# Patient Record
Sex: Male | Born: 1940
Health system: Southern US, Community
[De-identification: ages and names within clinical notes are randomized; demographics above are authoritative.]

## PROBLEM LIST (undated history)

## (undated) DIAGNOSIS — E119 Type 2 diabetes mellitus without complications: Secondary | ICD-10-CM

## (undated) DIAGNOSIS — M109 Gout, unspecified: Secondary | ICD-10-CM

## (undated) DIAGNOSIS — I10 Essential (primary) hypertension: Secondary | ICD-10-CM

## (undated) DIAGNOSIS — K219 Gastro-esophageal reflux disease without esophagitis: Secondary | ICD-10-CM

## (undated) DIAGNOSIS — G2581 Restless legs syndrome: Secondary | ICD-10-CM

## (undated) HISTORY — PX: APPENDECTOMY: SHX54

## (undated) HISTORY — PX: CHOLECYSTECTOMY: SHX55

## (undated) HISTORY — PX: OTHER SURGICAL HISTORY: SHX169

---

## 2006-12-07 ENCOUNTER — Ambulatory Visit: Payer: Self-pay | Admitting: Orthopaedic Surgery

## 2006-12-14 ENCOUNTER — Other Ambulatory Visit: Payer: Self-pay

## 2006-12-14 ENCOUNTER — Ambulatory Visit: Payer: Self-pay | Admitting: Orthopaedic Surgery

## 2006-12-20 ENCOUNTER — Ambulatory Visit: Payer: Self-pay | Admitting: Orthopaedic Surgery

## 2009-08-22 ENCOUNTER — Ambulatory Visit: Payer: Self-pay | Admitting: Internal Medicine

## 2011-05-07 ENCOUNTER — Emergency Department: Payer: Self-pay | Admitting: Emergency Medicine

## 2011-06-06 ENCOUNTER — Ambulatory Visit: Payer: Self-pay | Admitting: Internal Medicine

## 2012-05-01 ENCOUNTER — Observation Stay: Payer: Self-pay | Admitting: Internal Medicine

## 2012-05-01 LAB — URINALYSIS, COMPLETE
Bilirubin,UR: NEGATIVE
Glucose,UR: 150 mg/dL (ref 0–75)
Hyaline Cast: 1
Nitrite: NEGATIVE
Ph: 5 (ref 4.5–8.0)
RBC,UR: 1 /HPF (ref 0–5)
Specific Gravity: 1.018 (ref 1.003–1.030)
Squamous Epithelial: <1

## 2012-05-01 LAB — COMPREHENSIVE METABOLIC PANEL
Anion Gap: 13 (ref 7–16)
Bilirubin,Total: 1.8 mg/dL — ABNORMAL HIGH (ref 0.2–1.0)
Chloride: 93 mmol/L — ABNORMAL LOW (ref 98–107)
Co2: 27 mmol/L (ref 21–32)
Creatinine: 1.24 mg/dL (ref 0.60–1.30)
EGFR (African American): >60
EGFR (Non-African Amer.): 58 — ABNORMAL LOW
Osmolality: 275 (ref 275–301)
Potassium: 3 mmol/L — ABNORMAL LOW (ref 3.5–5.1)
SGPT (ALT): 40 U/L (ref 12–78)
Sodium: 133 mmol/L — ABNORMAL LOW (ref 136–145)
Total Protein: 7.9 g/dL (ref 6.4–8.2)

## 2012-05-01 LAB — CBC
HCT: 41.6 % (ref 40.0–52.0)
MCH: 31.7 pg (ref 26.0–34.0)
MCHC: 34.8 g/dL (ref 32.0–36.0)
MCV: 91 fL (ref 80–100)
Platelet: 203 10*3/uL (ref 150–440)
RBC: 4.57 10*6/uL (ref 4.40–5.90)

## 2012-05-01 LAB — TROPONIN I: Troponin-I: <0.02 ng/mL

## 2012-05-02 LAB — CBC WITH DIFFERENTIAL/PLATELET
Basophil #: 0 10*3/uL (ref 0.0–0.1)
Basophil %: 0.2 %
Eosinophil #: 0.1 10*3/uL (ref 0.0–0.7)
HCT: 36.5 % — ABNORMAL LOW (ref 40.0–52.0)
HGB: 12.8 g/dL — ABNORMAL LOW (ref 13.0–18.0)
Lymphocyte #: 1.9 10*3/uL (ref 1.0–3.6)
MCH: 31.8 pg (ref 26.0–34.0)
MCHC: 34.9 g/dL (ref 32.0–36.0)
MCV: 91 fL (ref 80–100)
Monocyte #: 0.9 x10 3/mm (ref 0.2–1.0)
Monocyte %: 7.5 %
Neutrophil #: 8.7 10*3/uL — ABNORMAL HIGH (ref 1.4–6.5)
Platelet: 186 10*3/uL (ref 150–440)
RDW: 14.6 % — ABNORMAL HIGH (ref 11.5–14.5)

## 2012-05-02 LAB — COMPREHENSIVE METABOLIC PANEL
Albumin: 3.3 g/dL — ABNORMAL LOW (ref 3.4–5.0)
Alkaline Phosphatase: 57 U/L (ref 50–136)
Anion Gap: 10 (ref 7–16)
BUN: 14 mg/dL (ref 7–18)
Bilirubin,Total: 0.8 mg/dL (ref 0.2–1.0)
Co2: 27 mmol/L (ref 21–32)
Creatinine: 1.16 mg/dL (ref 0.60–1.30)
Osmolality: 274 (ref 275–301)
SGOT(AST): 23 U/L (ref 15–37)
SGPT (ALT): 30 U/L (ref 12–78)
Sodium: 134 mmol/L — ABNORMAL LOW (ref 136–145)
Total Protein: 6.6 g/dL (ref 6.4–8.2)

## 2012-05-07 LAB — CULTURE, BLOOD (SINGLE)

## 2012-08-19 ENCOUNTER — Emergency Department: Payer: Self-pay | Admitting: Emergency Medicine

## 2012-08-19 LAB — COMPREHENSIVE METABOLIC PANEL
Albumin: 3.7 g/dL (ref 3.4–5.0)
Alkaline Phosphatase: 49 U/L — ABNORMAL LOW (ref 50–136)
Anion Gap: 12 (ref 7–16)
Bilirubin,Total: 0.9 mg/dL (ref 0.2–1.0)
Calcium, Total: 8.4 mg/dL — ABNORMAL LOW (ref 8.5–10.1)
Chloride: 100 mmol/L (ref 98–107)
Creatinine: 1.2 mg/dL (ref 0.60–1.30)
EGFR (Non-African Amer.): >60
Osmolality: 280 (ref 275–301)
SGOT(AST): 29 U/L (ref 15–37)
SGPT (ALT): 32 U/L (ref 12–78)
Sodium: 136 mmol/L (ref 136–145)
Total Protein: 7.6 g/dL (ref 6.4–8.2)

## 2012-08-19 LAB — URINALYSIS, COMPLETE
Bilirubin,UR: NEGATIVE
Hyaline Cast: 3
Leukocyte Esterase: NEGATIVE
Nitrite: NEGATIVE
Ph: 5 (ref 4.5–8.0)
RBC,UR: 1 /HPF (ref 0–5)
Squamous Epithelial: <1

## 2012-08-19 LAB — BASIC METABOLIC PANEL
Anion Gap: 11 (ref 7–16)
BUN: 21 mg/dL — ABNORMAL HIGH (ref 7–18)
Calcium, Total: 7.8 mg/dL — ABNORMAL LOW (ref 8.5–10.1)
Chloride: 103 mmol/L (ref 98–107)
Co2: 24 mmol/L (ref 21–32)
EGFR (African American): >60
Osmolality: 282 (ref 275–301)

## 2012-08-19 LAB — CBC
HGB: 15.3 g/dL (ref 13.0–18.0)
MCH: 31 pg (ref 26.0–34.0)
MCHC: 34.3 g/dL (ref 32.0–36.0)
MCHC: 34.1 g/dL (ref 32.0–36.0)
Platelet: 199 10*3/uL (ref 150–440)
RBC: 4.95 10*6/uL (ref 4.40–5.90)
WBC: 10.6 10*3/uL (ref 3.8–10.6)

## 2012-08-19 LAB — TROPONIN I: Troponin-I: <0.02 ng/mL

## 2012-08-19 LAB — CK TOTAL AND CKMB (NOT AT ARMC): CK, Total: 50 U/L (ref 35–232)

## 2012-10-16 ENCOUNTER — Emergency Department: Payer: Self-pay | Admitting: Emergency Medicine

## 2012-10-16 LAB — COMPREHENSIVE METABOLIC PANEL
Anion Gap: 10 (ref 7–16)
BUN: 17 mg/dL (ref 7–18)
Bilirubin,Total: 0.5 mg/dL (ref 0.2–1.0)
Creatinine: 1.17 mg/dL (ref 0.60–1.30)
EGFR (African American): >60
Glucose: 133 mg/dL — ABNORMAL HIGH (ref 65–99)
Potassium: 3.1 mmol/L — ABNORMAL LOW (ref 3.5–5.1)
SGOT(AST): 31 U/L (ref 15–37)
Total Protein: 7.8 g/dL (ref 6.4–8.2)

## 2012-10-16 LAB — CBC
HCT: 38.5 % — ABNORMAL LOW (ref 40.0–52.0)
MCH: 30.7 pg (ref 26.0–34.0)
MCHC: 34.2 g/dL (ref 32.0–36.0)
Platelet: 209 10*3/uL (ref 150–440)
RBC: 4.3 10*6/uL — ABNORMAL LOW (ref 4.40–5.90)
RDW: 15.2 % — ABNORMAL HIGH (ref 11.5–14.5)
WBC: 8.9 10*3/uL (ref 3.8–10.6)

## 2013-01-01 ENCOUNTER — Ambulatory Visit: Payer: Self-pay | Admitting: Gastroenterology

## 2013-01-03 LAB — PATHOLOGY REPORT

## 2013-01-28 ENCOUNTER — Ambulatory Visit: Payer: Self-pay

## 2013-01-28 LAB — URINALYSIS, COMPLETE

## 2013-01-30 LAB — URINE CULTURE

## 2013-04-30 ENCOUNTER — Emergency Department: Payer: Self-pay | Admitting: Emergency Medicine

## 2013-04-30 LAB — CBC
HGB: 13.2 g/dL (ref 13.0–18.0)
Platelet: 172 10*3/uL (ref 150–440)
RBC: 4.16 10*6/uL — ABNORMAL LOW (ref 4.40–5.90)
RDW: 14.5 % (ref 11.5–14.5)
WBC: 8.8 10*3/uL (ref 3.8–10.6)

## 2013-04-30 LAB — COMPREHENSIVE METABOLIC PANEL
Albumin: 3.5 g/dL (ref 3.4–5.0)
Alkaline Phosphatase: 61 U/L
Anion Gap: 10 (ref 7–16)
BUN: 10 mg/dL (ref 7–18)
Chloride: 96 mmol/L — ABNORMAL LOW (ref 98–107)
EGFR (Non-African Amer.): >60
Osmolality: 267 (ref 275–301)
Potassium: 2.9 mmol/L — ABNORMAL LOW (ref 3.5–5.1)
SGOT(AST): 38 U/L — ABNORMAL HIGH (ref 15–37)

## 2013-04-30 LAB — CK TOTAL AND CKMB (NOT AT ARMC)
CK, Total: 47 U/L (ref 35–232)
CK-MB: <0.5 ng/mL — ABNORMAL LOW (ref 0.5–3.6)

## 2013-04-30 LAB — TROPONIN I: Troponin-I: <0.02 ng/mL

## 2013-06-10 ENCOUNTER — Ambulatory Visit: Payer: Self-pay

## 2013-06-10 LAB — RAPID INFLUENZA A&B ANTIGENS (ARMC ONLY)

## 2013-06-14 ENCOUNTER — Observation Stay: Payer: Self-pay | Admitting: Internal Medicine

## 2013-06-14 LAB — CBC WITH DIFFERENTIAL/PLATELET
BASOS PCT: 0.6 %
Basophil #: 0.1 10*3/uL (ref 0.0–0.1)
Eosinophil #: 0.7 10*3/uL (ref 0.0–0.7)
Eosinophil %: 6.8 %
HCT: 39.4 % — ABNORMAL LOW (ref 40.0–52.0)
HGB: 13.6 g/dL (ref 13.0–18.0)
LYMPHS PCT: 19.2 %
Lymphocyte #: 1.9 10*3/uL (ref 1.0–3.6)
MCH: 30.7 pg (ref 26.0–34.0)
MCHC: 34.5 g/dL (ref 32.0–36.0)
MCV: 89 fL (ref 80–100)
Monocyte #: 0.8 x10 3/mm (ref 0.2–1.0)
Monocyte %: 7.9 %
Neutrophil #: 6.5 10*3/uL (ref 1.4–6.5)
Neutrophil %: 65.5 %
PLATELETS: 199 10*3/uL (ref 150–440)
RBC: 4.42 10*6/uL (ref 4.40–5.90)
RDW: 14.1 % (ref 11.5–14.5)
WBC: 10 10*3/uL (ref 3.8–10.6)

## 2013-06-14 LAB — COMPREHENSIVE METABOLIC PANEL
ALK PHOS: 60 U/L
ALT: 35 U/L (ref 12–78)
AST: 33 U/L (ref 15–37)
Albumin: 3.4 g/dL (ref 3.4–5.0)
Anion Gap: 12 (ref 7–16)
BILIRUBIN TOTAL: 0.4 mg/dL (ref 0.2–1.0)
BUN: 25 mg/dL — AB (ref 7–18)
Calcium, Total: 8.8 mg/dL (ref 8.5–10.1)
Chloride: 99 mmol/L (ref 98–107)
Co2: 21 mmol/L (ref 21–32)
Creatinine: 1.53 mg/dL — ABNORMAL HIGH (ref 0.60–1.30)
EGFR (African American): 52 — ABNORMAL LOW
EGFR (Non-African Amer.): 45 — ABNORMAL LOW
Glucose: 190 mg/dL — ABNORMAL HIGH (ref 65–99)
Osmolality: 274 (ref 275–301)
Potassium: 3.2 mmol/L — ABNORMAL LOW (ref 3.5–5.1)
Sodium: 132 mmol/L — ABNORMAL LOW (ref 136–145)
Total Protein: 7.3 g/dL (ref 6.4–8.2)

## 2013-06-14 LAB — RAPID INFLUENZA A&B ANTIGENS (ARMC ONLY)

## 2013-06-14 LAB — CK TOTAL AND CKMB (NOT AT ARMC)
CK, Total: 46 U/L (ref 35–232)
CK-MB: 0.9 ng/mL (ref 0.5–3.6)

## 2013-06-14 LAB — TROPONIN I: Troponin-I: <0.02 ng/mL

## 2013-06-15 LAB — BASIC METABOLIC PANEL
Anion Gap: 9 (ref 7–16)
BUN: 13 mg/dL (ref 7–18)
CALCIUM: 8.5 mg/dL (ref 8.5–10.1)
Chloride: 100 mmol/L (ref 98–107)
Co2: 25 mmol/L (ref 21–32)
Creatinine: 1.14 mg/dL (ref 0.60–1.30)
EGFR (African American): >60
Glucose: 126 mg/dL — ABNORMAL HIGH (ref 65–99)
Osmolality: 270 (ref 275–301)
Potassium: 2.6 mmol/L — ABNORMAL LOW (ref 3.5–5.1)
Sodium: 134 mmol/L — ABNORMAL LOW (ref 136–145)

## 2013-06-15 LAB — MAGNESIUM: MAGNESIUM: 1 mg/dL — AB

## 2013-06-15 LAB — POTASSIUM: Potassium: 3.3 mmol/L — ABNORMAL LOW (ref 3.5–5.1)

## 2013-06-19 LAB — CULTURE, BLOOD (SINGLE)

## 2013-08-04 ENCOUNTER — Ambulatory Visit: Payer: Self-pay | Admitting: Physician Assistant

## 2013-10-04 DIAGNOSIS — M109 Gout, unspecified: Secondary | ICD-10-CM | POA: Insufficient documentation

## 2013-10-04 DIAGNOSIS — I10 Essential (primary) hypertension: Secondary | ICD-10-CM | POA: Insufficient documentation

## 2013-10-04 DIAGNOSIS — N529 Male erectile dysfunction, unspecified: Secondary | ICD-10-CM | POA: Insufficient documentation

## 2014-09-16 NOTE — H&P (Signed)
PATIENT NAME:  Ruben Foster, Ruben Foster MR#:  [id number] DATE OF BIRTH:  January 10, 1941  DATE OF ADMISSION:  05/01/2012  REFERRING PHYSICIAN: Dr. Charlesetta Ivory   PRIMARY CARE PHYSICIAN: Dr. Domenick Gong    CHIEF COMPLAINT: Vomiting followed by chest pain, cough, weakness, not feeling well.   HISTORY OF PRESENT ILLNESS: This is a 74 year old male with significant past medical history of hypertension, gout, and type II diabetes who started having cough, some nasal congestion and drainage, and sore throat. He saw his PCP yesterday, Dr. Ilene Qua, and he was prescribed p.o. doxycycline. The patient reports he developed a few episodes of vomiting which was followed by chest pain. This prompted him to come to the ED. The patient currently reports his chest pain has resolved. He describes it as being followed by vomiting around the epigastric area. Denies any shortness of breath, palpitation, or shortness of breath with the vomiting. The patient was afebrile in the ED but with significant leukocytosis of 17,000. Chest x-ray did not show any concrete evidence of infiltrate or pneumonia. The patient had negative troponins. The patient as well was found to have hypokalemia of 3. Hospitalist service was requested to admit the patient for further management of his symptoms.   PAST MEDICAL HISTORY:  1. Gout.  2. Hypertension.  3. Erectile dysfunction.  4. Type II diabetes.   PAST SURGICAL HISTORY: Unremarkable.   ALLERGIES: Strawberry.   HOME MEDICATIONS:  1. Ziac tablet 5/6.25 one tablet oral daily.  2. Nitroglycerin p.r.n.  3. Lisinopril/hydrochlorothiazide 20/25 one tablet oral daily.  4. Allopurinol 300 mg oral daily.  5. Metformin 1 gram oral 2 times a day.  6. Potassium 10 mEq oral 2 times a day.  7. Amaryl 2 mg 1 tablet twice a day.   FAMILY HISTORY: Significant for hypertension and CVA.   SOCIAL HISTORY: Quit smoking two years ago. No alcohol or illicit drug use.   REVIEW OF SYSTEMS: Denies any  fever. Complains of fatigue and weakness. EYES: Denies blurry vision, double vision, or pain. ENT: Denies tinnitus, ear pain, hearing loss. RESPIRATORY: Denies any hemoptysis, any dyspnea, any COPD. Complains of cough, nonproductive, and nasal drainage. CARDIOVASCULAR: Complains of chest pain developed after vomiting in the epigastric area. Denies any edema, arrhythmia, palpitations, syncope. GI: Complains of nausea and vomiting. Denies any diarrhea, abdominal pain, hematemesis, melena. GU: Denies dysuria, hematuria, renal colic. ENDOCRINE: Denies polyuria, polydipsia, heat or cold intolerance. INTEGUMENTARY: Denies acne, rash, or lesion. MUSCULOSKELETAL: Denies any neck pain, shoulder pain, knee pain. Has history of gout. Denies any cramps. NEUROLOGIC: Denies numbness, weakness, dysarthria, epilepsy, tremors, vertigo. PSYCHIATRIC: Denies anxiety, schizophrenia, nervousness, insomnia, bipolar disorder, or depression.   PHYSICAL EXAMINATION:   VITAL SIGNS: Temperature 98, pulse 90, respiratory rate 20, blood pressure 158/84, saturating 98% on room air.   GENERAL: Elderly male who looks comfortable in bed in no apparent distress.   HEENT: Head atraumatic, normocephalic. Pupils equal and reactive to light. Pink conjunctivae. Anicteric sclerae. Moist oral mucosa.   NECK: Supple. No thyromegaly. No JVD.   CHEST: Good air entry bilaterally with mild rales in the right lung.   CARDIOVASCULAR: S1, S2 heard. No rubs, murmur, or gallops.   ABDOMEN: Soft, nontender, nondistended. Bowel sounds present.   EXTREMITIES: No edema. No clubbing. No cyanosis.   PSYCHIATRIC: Appropriate affect. Awake, alert x3. Intact judgment and insight.   NEUROLOGIC: Cranial nerves grossly intact. Motor 5 out of 5 in all extremities.   SKIN: Normal skin turgor. Warm and dry.  PERTINENT LABS: Glucose 249, BUN 14, creatinine 1.24, sodium 133, potassium 3, chloride 93, CO2 27, ALT 40, AST 27, alkaline phosphatase 65, total  bilirubin 1.8. Troponin less than 0.02. White blood cells 17.4, hemoglobin 14.5, hematocrit 41.6, platelets 203.    ASSESSMENT AND PLAN: This is a 74 year old male who presented with upper respiratory symptoms to his PCP yesterday, did not improve on p.o. doxycycline and presents today with vomiting followed by epigastric/chest pain.  1. Acute upper respiratory infection. The patient is complaining of sore throat and cough with runny nose. He will be started on IV Levaquin and at discharge can be switched to p.o. Levaquin. He will be given mucinex as well.  2. Chest pain. This is atypical, was developed after vomiting, currently resolved. This is most likely related to vomiting episodes. Will start the patient on Protonix but will cycle troponins and follow the trend.  3. Hypokalemia. This is most likely related to vomiting. Will replace and recheck in a.m.  4. Diabetes mellitus. Will start on insulin sliding scale. Hold Amaryl and metformin.  5. Hypertension. Will continue home medication.  6. Gout. No complaints. Will continue patient on allopurinol.  7. DVT prophylaxis. Sub-Q heparin.  8. GI prophylaxis. On Protonix.   CODE STATUS: FULL CODE.   TOTAL TIME SPENT ON ADMISSION AND PATIENT CARE: 50 minutes.   ____________________________ Albertine Patricia, MD dse:drc D: 05/01/2012 08:32:03 ET T: 05/01/2012 09:30:40 ET JOB#: [id number]  cc: Albertine Patricia, MD, <Dictator> Fonnie Jarvis. Ilene Qua, MD Florestine Carmical Graciela Husbands MD ELECTRONICALLY SIGNED 05/02/2012 1:30

## 2014-09-16 NOTE — Discharge Summary (Signed)
PATIENT NAME:  Ruben Foster, DEGREGORY MR#:  [id number] DATE OF BIRTH:  [birth year]  DATE OF ADMISSION:  05/01/2012 DATE OF DISCHARGE:  05/02/2012  DISCHARGE DIAGNOSES:  1. Mild pneumonia, improving with antibiotic.  2. Chest pain, likely due to acid reflux, improving while on Protonix. 3. Gastroesophageal reflux disease, improving on Protonix and Zantac.   4. Hypokalemia, repleted and resolved.   SECONDARY DIAGNOSES:  1. Hypertension.  2. Gout.  3. Erectile dysfunction.  4. Type II diabetes.   CONSULTATIONS: None.   PROCEDURES/RADIOLOGY: Chest x-ray on December 3rd showed mild atelectasis and possible lung base mild pneumonia.   MAJOR LABORATORY PANEL: Urinalysis on admission was negative. Blood cultures x2 were negative.   HISTORY AND SHORT HOSPITAL COURSE: The patient is a 74 year old male with above-mentioned medical problems who was admitted for upper respiratory tract infection confirmed by chest x-ray as mild pneumonia, was started on antibiotic. Please see Dr. Graciela Husbands dictated history and physical for further details. The patient was doing much better while on antibiotic. The patient also had minimal chest pain which was thought to be secondary to acid reflux which was improving with twice a day Protonix. He also had hypokalemia which was repleted and resolved. He was close to his baseline on December 4th and was discharged home in stable condition.   On the date of discharge, his vital signs were as follows: Temperature 98.4, heart rate 76 per minute, respirations 18 per minute, blood pressure 123/76 mmHg. He was saturating 94% on room air.   PERTINENT PHYSICAL EXAMINATION ON THE DATE OF DISCHARGE: CARDIOVASCULAR: S1, S2 normal. No murmurs, rubs, or gallop. LUNGS: Clear to auscultation bilaterally. No wheezing, rales, rhonchi, or crepitation. ABDOMEN: Soft, benign. NEUROLOGIC: Nonfocal examination. All other physical examination remained at baseline.   DISCHARGE MEDICATIONS:   1. Allopurinol 300 mg p.o. b.i.d.  2. Hydrochlorothiazide/lisinopril 25/20 mg 1 tablet p.o. daily.  3. Metformin 1000 mg p.o. b.i.d.  4. Amaryl 2 mg p.o. b.i.d.  5. Klor-Con 10 mg 1 tablet p.o. b.i.d.  6. Bisoprolol/hydrochlorothiazide 5/6.25 mg 1 tablet p.o. daily.  7. Nitroglycerin 0.4 mg sublingual as needed.  8. Guaifenesin 600 mg p.o. b.i.d.  9. Protonix 40 mg p.o. b.i.d.  10. Levaquin 500 mg p.o. daily for five more days.   DISCHARGE DIET: Regular.   DISCHARGE ACTIVITY: As tolerated.   DISCHARGE INSTRUCTIONS AND FOLLOW-UP:  1. The patient was instructed to follow-up with his primary care physician, Dr. Domenick Gong, in 1 to 2 weeks.  2. He was instructed to continue all his home medication as he has been taking.   TOTAL TIME DISCHARGING THIS PATIENT: 55 minutes.   ____________________________ Lucina Mellow. Manuella Ghazi, MD vss:drc D: 05/04/2012 00:11:05 ET T: 05/04/2012 12:27:06 ET JOB#: [id number]  cc: Evagelia Knack S. Manuella Ghazi, MD, <Dictator> Fonnie Jarvis. Ilene Qua, [place] MD ELECTRONICALLY SIGNED 05/04/2012 14:29

## 2014-09-20 NOTE — Discharge Summary (Signed)
PATIENT NAME:  Ruben Foster, Ruben Foster MR#:  [id number] DATE OF BIRTH:  04/13/1941  DATE OF ADMISSION:  06/14/2013 DATE OF DISCHARGE:  06/15/2013  DISCHARGE DIAGNOSES: 1.  Sinusitis, nasal drainage.  2.  Dizziness due to dehydration and nasal congestion with headache.  3.  Acute renal failure, rising creatinine.  4.  Electrolyte imbalance.   CODE STATUS ON DISCHARGE: Full code.   MEDICATIONS ON DISCHARGE: 1.  Protonix 40 mg delayed-release tablet 2 times a day.  2.  Metformin 1000 mg oral tablet 2 times a day.  3.  Allopurinol 100 mg oral once a day.  4.  ProAir inhalation 2 puffs 4 times a day for 30 days.  5.  Potassium chloride 20 mEq oral tablet 2 times a day for 7 days.  6.  Augmentin 875 mg oral 2 times a day for 4 days.   DIET ON DISCHARGE: Low-sodium diet, consistency regular.   ACTIVITY: As tolerated.   TIMEFRAME TO FOLLOWUP: Within 2 to 4 weeks.  Advised to follow with Dr. Domenick Gong, [facility].   HISTORY OF PRESENTING ILLNESS: A 74 year old Caucasian male with past medical history of diabetes, hypertension, presented with generalized malaise and presyncope. He had 5-day duration of symptoms including fever, chills, cough which was nonproductive and had some sick contacts. He was started on Z-Pak by urgent care center and after 3 days of treatment he still did not have any improvement in the symptoms. While he was getting up from sitting position,  he felt some lightheaded and actually lost some balance and fell down. No head trauma, no loss of consciousness, so came to the Emergency Room for that and found some drop in orthostatic vitals, also complaining of generalized malaise.   HOSPITAL COURSE AND STAY:  1.  As he was found having some dizziness associated with his respiratory symptoms, on further examination he was found having sinusitis. He was taking Z-Pak at home so I changed it to Augmentin and gave some nasal spray for his nasal stuffiness.  His presyncopal  episode monitored on telemetry, gave some IV fluid for hydration with normal saline and was feeling better with that. The next day after the treatment he was feeling much better and was no longer feeling wheezy so we discharged him home.  2.  Acute kidney injury. This was present on admission which was most likely due to dehydration and after we gave him IV flow his kidney function improved.  3.  Hyponatremia. Again, this was due to dehydration and after IV fluids it improved.  4.  Hypokalemia and we checked magnesium which was also low. We replaced it and discharged him home.   IMPORTANT LABORATORY AND RADIOLOGICAL RESULTS IN THE HOSPITAL: Blood culture was negative. White cell count 10,000, hemoglobin 13.6, platelet count 199 and MCV was 89. Glucose was 190, creatinine 1.53, sodium 132 and potassium 3.2. Influenza A and B were negative. Troponin less than 0.02. Chest x-ray, portable, single view showed suboptimal inspiration, no acute cardiopulmonary disease. On next day creatinine came 1.14, potassium level after correction came up to 3.3.   TOTAL TIME SPENT ON THIS DISCHARGE: 40 minutes.   ____________________________ Ceasar Lund Anselm Jungling, MD vgv:cs D: 06/19/2013 15:42:00 ET T: 06/19/2013 20:47:00 ET JOB#: [id number]  cc: Ceasar Lund. Anselm Jungling, MD, <Dictator> Vaughan Basta MD ELECTRONICALLY SIGNED 06/21/2013 18:52

## 2014-09-20 NOTE — H&P (Signed)
PATIENT NAME:  Ruben Foster, PELCHER MR#:  [id number] DATE OF BIRTH:  [birth year]  DATE OF ADMISSION:  06/14/2013  REFERRING PHYSICIAN:  Dr. Beather Arbour.   PRIMARY CARE PHYSICIAN:  Dr. Ilene Qua.   CHIEF COMPLAINT:  Generalized malaise, presyncope.   HISTORY OF PRESENT ILLNESS:  A 74 year old Caucasian gentleman with past medical history of diabetes, hypertension, presenting with generalized malaise and presyncope.  He had a 5 day duration of symptoms including fevers, chills, cough which was nonproductive, had positive sick contacts.  Denies any nausea, vomiting, diarrhea.  Went to urgent care at the beginning of symptoms, was given Z-Pak.  He is currently on day three without improvement of symptoms.  Today, while he was going from sitting to standing position in his living room he felt lightheaded and actually lost balance and fell down.  No head trauma.  No loss of consciousness.  In the Emergency Department, whenever attempting to perform orthostatic vital signs the patient became symptomatic describing lightheadedness with the feeling to sit down.  Currently, complaining of generalized malaise.   REVIEW OF SYSTEMS:  CONSTITUTIONAL:  Positive for fevers, fatigue, weakness, chills.  EYES:  Denied blurred vision, double vision, eye pain.  EARS, NOSE, THROAT:  Denies tinnitus, ear pain, hearing loss.  RESPIRATORY:  Positive for cough, nonproductive, as well as shortness of breath as described above.  Denies any wheeze or hemoptysis.  CARDIOVASCULAR:  No chest pain, palpitations, edema.  GASTROINTESTINAL:  Denies nausea, vomiting, diarrhea, abdominal pain.  GENITOURINARY:  Denies dysuria or hematuria.  ENDOCRINE:  Denies nocturia or thyroid problems.  HEMATOLOGY AND LYMPHATIC:  Denies bruising or bleeding.  SKIN:  Denies rash or lesions.  MUSCULOSKELETAL:  Denies pain in neck, back, shoulder, knees, hips, arthritic symptoms.  NEUROLOGIC:  Denies paralysis, paresthesias.  PSYCHIATRIC:  Denies any anxiety or  depressive symptoms.  Otherwise, full review of systems performed is negative.   PAST MEDICAL HISTORY:  Hypertension, diabetes, gout.   SOCIAL HISTORY:  Denies any current alcohol, tobacco or drug usage.  Does have a history of tobacco usage.   FAMILY HISTORY:  Hypertension, stroke.   ALLERGIES:  STRAWBERRIES.   HOME MEDICATIONS:  He is uncertain of his medication list, though he does take metformin 1000 mg by mouth twice daily, allopurinol 100 mg daily, he is also on hydrochlorothiazide/lisinopril 25/20 mg by mouth daily and Protonix 40 mg by mouth twice daily.   PHYSICAL EXAMINATION: VITAL SIGNS:  Temperature 97.8, heart rate 94, respirations 36, blood pressure 115/66, saturating 99% on room air.  GENERAL:  Well-nourished, well-developed, Caucasian gentleman, currently in no distress.  HEAD:  Normocephalic, atraumatic.  EYES:  Pupils equal, round and reactive to light.  Extraocular muscles intact.  No scleral icterus.  MOUTH:  Dry mucosal membranes.  Dentition intact.  No abscess noted.   EARS, NOSE, THROAT:  Throat clear without exudates.  No external lesions.  NECK:  Supple.  No thyromegaly.  No nodules.  No JVD.  PULMONARY:  Clear to auscultation bilaterally.  No wheezes, rubs or rhonchi.  No use of accessory muscles.  Good respiratory effort.  CHEST:  Nontender to palpation.  CARDIOVASCULAR:  S1, S2, regular rate and rhythm.  No murmurs, rubs, or gallops.  No edema.  Pedal pulses 2+ bilaterally. GASTROINTESTINAL:  Soft, nontender, nondistended.  No masses.  Positive bowel sounds.  No hepatosplenomegaly.  MUSCULOSKELETAL:  No swelling, clubbing, edema.  Range of motion full in all extremities.  NEUROLOGIC:  Cranial nerves II through XII intact.  No gross  focal motor neurological deficits.  Sensation intact.  Reflexes intact.  SKIN:  No ulcerations, lesions, rashes, cyanosis.  Skin warm and dry.  Turgor intact.  PSYCHIATRIC:  Mood and affect within normal limits.  The patient awake,  alert, oriented x 3.  Insight and judgment intact.   LABORATORY DATA:  Sodium 132, potassium 3.2, chloride 99, bicarb 21, BUN 25, creatinine 1.53, glucose 190.  LFTs within normal limits.  WBC 10, hemoglobin 13.6, platelets 199.  Influenza negative.  Chest x-ray performed, no acute cardiopulmonary process.  EKG performed, normal sinus rhythm with right bundle branch block, heart rate 90.   ASSESSMENT AND PLAN:  A 74 year old Caucasian gentleman with history of diabetes, hypertension, presenting with generalized malaise and presyncopal episode.  1.  Presyncope.  EKG revealing a right bundle branch block, but otherwise no further conduction delays.  This appears to be orthostatic in nature.  IV fluid hydration with normal saline.  Reassess in the morning.  Correct electrolytes including potassium to a goal of 4, magnesium to a goal of 2. 2.  Acute kidney injury.  IV fluid hydration with normal saline.  Hold nephrotoxic agents including hydrochlorothiazide and ACE inhibitors.  Follow her urine output and renal function.   3.  Hyponatremia.  IV fluid hydration with normal saline.  Follow sodium levels.  4.  Hypokalemia.  Replace potassium to goal of 4. 5.  Bronchitis, has been given Levaquin, continue.  I will provide DuoNeb therapy q. 4 hours while awake, supplemental O2 to keep oxygen saturation greater than 92% as well as flutter valve therapy.  6.  VTE prophylaxis with heparin subQ.  7.  THE PATIENT IS A FULL CODE.   TIME SPENT:  45 minutes.     ____________________________ [name], MD dkh:ea D: 06/14/2013 03:20:09 ET T: 06/14/2013 04:24:03 ET JOB#: [id number]  cc: [name], MD, <Dictator> Patrick Woodfin Ganja MD ELECTRONICALLY SIGNED 06/15/2013 21:43

## 2015-06-04 DIAGNOSIS — E1142 Type 2 diabetes mellitus with diabetic polyneuropathy: Secondary | ICD-10-CM | POA: Diagnosis not present

## 2015-06-04 DIAGNOSIS — I10 Essential (primary) hypertension: Secondary | ICD-10-CM | POA: Diagnosis not present

## 2015-06-04 DIAGNOSIS — E781 Pure hyperglyceridemia: Secondary | ICD-10-CM | POA: Diagnosis not present

## 2016-04-06 DIAGNOSIS — M79672 Pain in left foot: Secondary | ICD-10-CM | POA: Diagnosis not present

## 2016-04-13 DIAGNOSIS — S93602A Unspecified sprain of left foot, initial encounter: Secondary | ICD-10-CM | POA: Diagnosis not present

## 2016-04-25 DIAGNOSIS — L298 Other pruritus: Secondary | ICD-10-CM | POA: Diagnosis not present

## 2016-06-13 DIAGNOSIS — K5900 Constipation, unspecified: Secondary | ICD-10-CM | POA: Diagnosis not present

## 2016-06-17 DIAGNOSIS — K5900 Constipation, unspecified: Secondary | ICD-10-CM | POA: Diagnosis not present

## 2016-08-08 DIAGNOSIS — K5900 Constipation, unspecified: Secondary | ICD-10-CM | POA: Diagnosis not present

## 2016-08-08 DIAGNOSIS — R109 Unspecified abdominal pain: Secondary | ICD-10-CM | POA: Diagnosis not present

## 2016-09-19 DIAGNOSIS — R194 Change in bowel habit: Secondary | ICD-10-CM | POA: Diagnosis not present

## 2016-09-19 DIAGNOSIS — R10812 Left upper quadrant abdominal tenderness: Secondary | ICD-10-CM | POA: Diagnosis not present

## 2016-09-19 DIAGNOSIS — E119 Type 2 diabetes mellitus without complications: Secondary | ICD-10-CM | POA: Diagnosis not present

## 2016-11-04 DIAGNOSIS — K219 Gastro-esophageal reflux disease without esophagitis: Secondary | ICD-10-CM | POA: Insufficient documentation

## 2016-11-16 DIAGNOSIS — E781 Pure hyperglyceridemia: Secondary | ICD-10-CM | POA: Diagnosis not present

## 2016-11-16 DIAGNOSIS — R10812 Left upper quadrant abdominal tenderness: Secondary | ICD-10-CM | POA: Diagnosis not present

## 2016-11-16 DIAGNOSIS — R1032 Left lower quadrant pain: Secondary | ICD-10-CM | POA: Diagnosis not present

## 2016-11-16 DIAGNOSIS — K635 Polyp of colon: Secondary | ICD-10-CM | POA: Diagnosis not present

## 2016-11-16 DIAGNOSIS — M109 Gout, unspecified: Secondary | ICD-10-CM | POA: Diagnosis not present

## 2016-11-16 DIAGNOSIS — D123 Benign neoplasm of transverse colon: Secondary | ICD-10-CM | POA: Diagnosis not present

## 2016-11-16 DIAGNOSIS — K219 Gastro-esophageal reflux disease without esophagitis: Secondary | ICD-10-CM | POA: Diagnosis not present

## 2016-11-16 DIAGNOSIS — Z7984 Long term (current) use of oral hypoglycemic drugs: Secondary | ICD-10-CM | POA: Diagnosis not present

## 2016-11-16 DIAGNOSIS — I10 Essential (primary) hypertension: Secondary | ICD-10-CM | POA: Diagnosis not present

## 2016-11-16 DIAGNOSIS — E1140 Type 2 diabetes mellitus with diabetic neuropathy, unspecified: Secondary | ICD-10-CM | POA: Diagnosis not present

## 2016-11-16 DIAGNOSIS — Z79899 Other long term (current) drug therapy: Secondary | ICD-10-CM | POA: Diagnosis not present

## 2016-11-16 DIAGNOSIS — R194 Change in bowel habit: Secondary | ICD-10-CM | POA: Diagnosis not present

## 2017-02-09 DIAGNOSIS — K219 Gastro-esophageal reflux disease without esophagitis: Secondary | ICD-10-CM | POA: Diagnosis not present

## 2017-02-09 DIAGNOSIS — Z125 Encounter for screening for malignant neoplasm of prostate: Secondary | ICD-10-CM | POA: Diagnosis not present

## 2017-02-09 DIAGNOSIS — E1142 Type 2 diabetes mellitus with diabetic polyneuropathy: Secondary | ICD-10-CM | POA: Diagnosis not present

## 2017-02-09 DIAGNOSIS — Z720 Tobacco use: Secondary | ICD-10-CM | POA: Diagnosis not present

## 2017-02-09 DIAGNOSIS — E781 Pure hyperglyceridemia: Secondary | ICD-10-CM | POA: Diagnosis not present

## 2017-02-09 DIAGNOSIS — Z79899 Other long term (current) drug therapy: Secondary | ICD-10-CM | POA: Diagnosis not present

## 2017-02-09 DIAGNOSIS — G2581 Restless legs syndrome: Secondary | ICD-10-CM | POA: Diagnosis not present

## 2017-02-09 DIAGNOSIS — I10 Essential (primary) hypertension: Secondary | ICD-10-CM | POA: Diagnosis not present

## 2017-02-09 DIAGNOSIS — M1A9XX0 Chronic gout, unspecified, without tophus (tophi): Secondary | ICD-10-CM | POA: Diagnosis not present

## 2017-02-13 DIAGNOSIS — G2581 Restless legs syndrome: Secondary | ICD-10-CM | POA: Insufficient documentation

## 2017-02-16 DIAGNOSIS — Z125 Encounter for screening for malignant neoplasm of prostate: Secondary | ICD-10-CM | POA: Diagnosis not present

## 2017-02-16 DIAGNOSIS — E1142 Type 2 diabetes mellitus with diabetic polyneuropathy: Secondary | ICD-10-CM | POA: Diagnosis not present

## 2017-02-16 DIAGNOSIS — I10 Essential (primary) hypertension: Secondary | ICD-10-CM | POA: Diagnosis not present

## 2017-02-16 DIAGNOSIS — E781 Pure hyperglyceridemia: Secondary | ICD-10-CM | POA: Diagnosis not present

## 2017-02-16 DIAGNOSIS — Z79899 Other long term (current) drug therapy: Secondary | ICD-10-CM | POA: Diagnosis not present

## 2017-02-27 DIAGNOSIS — E538 Deficiency of other specified B group vitamins: Secondary | ICD-10-CM | POA: Insufficient documentation

## 2017-08-23 DIAGNOSIS — I10 Essential (primary) hypertension: Secondary | ICD-10-CM | POA: Diagnosis not present

## 2017-08-23 DIAGNOSIS — E538 Deficiency of other specified B group vitamins: Secondary | ICD-10-CM | POA: Diagnosis not present

## 2017-08-23 DIAGNOSIS — K219 Gastro-esophageal reflux disease without esophagitis: Secondary | ICD-10-CM | POA: Diagnosis not present

## 2017-08-23 DIAGNOSIS — Z79899 Other long term (current) drug therapy: Secondary | ICD-10-CM | POA: Diagnosis not present

## 2017-08-23 DIAGNOSIS — G2581 Restless legs syndrome: Secondary | ICD-10-CM | POA: Diagnosis not present

## 2017-08-23 DIAGNOSIS — E1140 Type 2 diabetes mellitus with diabetic neuropathy, unspecified: Secondary | ICD-10-CM | POA: Diagnosis not present

## 2017-08-23 DIAGNOSIS — E781 Pure hyperglyceridemia: Secondary | ICD-10-CM | POA: Diagnosis not present

## 2017-09-25 DIAGNOSIS — E113291 Type 2 diabetes mellitus with mild nonproliferative diabetic retinopathy without macular edema, right eye: Secondary | ICD-10-CM | POA: Diagnosis not present

## 2017-10-21 ENCOUNTER — Observation Stay
Admission: EM | Admit: 2017-10-21 | Discharge: 2017-10-22 | Disposition: A | Discharge status: Home / Self Care | Payer: PPO | Attending: Internal Medicine | Admitting: Internal Medicine

## 2017-10-21 ENCOUNTER — Other Ambulatory Visit: Payer: Self-pay

## 2017-10-21 ENCOUNTER — Encounter: Payer: Self-pay | Admitting: Emergency Medicine

## 2017-10-21 ENCOUNTER — Emergency Department: Payer: PPO

## 2017-10-21 ENCOUNTER — Ambulatory Visit (INDEPENDENT_AMBULATORY_CARE_PROVIDER_SITE_OTHER)
Admission: EM | Admit: 2017-10-21 | Discharge: 2017-10-21 | Disposition: A | Discharge status: Home / Self Care | Payer: PPO | Source: Home / Self Care | Attending: Family Medicine | Admitting: Family Medicine

## 2017-10-21 DIAGNOSIS — E878 Other disorders of electrolyte and fluid balance, not elsewhere classified: Secondary | ICD-10-CM | POA: Diagnosis not present

## 2017-10-21 DIAGNOSIS — I10 Essential (primary) hypertension: Secondary | ICD-10-CM | POA: Diagnosis not present

## 2017-10-21 DIAGNOSIS — F17290 Nicotine dependence, other tobacco product, uncomplicated: Secondary | ICD-10-CM | POA: Insufficient documentation

## 2017-10-21 DIAGNOSIS — E119 Type 2 diabetes mellitus without complications: Secondary | ICD-10-CM | POA: Diagnosis not present

## 2017-10-21 DIAGNOSIS — A080 Rotaviral enteritis: Secondary | ICD-10-CM | POA: Diagnosis not present

## 2017-10-21 DIAGNOSIS — Z79899 Other long term (current) drug therapy: Secondary | ICD-10-CM | POA: Diagnosis not present

## 2017-10-21 DIAGNOSIS — E876 Hypokalemia: Secondary | ICD-10-CM | POA: Insufficient documentation

## 2017-10-21 DIAGNOSIS — Z91018 Allergy to other foods: Secondary | ICD-10-CM | POA: Diagnosis not present

## 2017-10-21 DIAGNOSIS — K219 Gastro-esophageal reflux disease without esophagitis: Secondary | ICD-10-CM | POA: Diagnosis not present

## 2017-10-21 DIAGNOSIS — Z7984 Long term (current) use of oral hypoglycemic drugs: Secondary | ICD-10-CM | POA: Insufficient documentation

## 2017-10-21 DIAGNOSIS — Z825 Family history of asthma and other chronic lower respiratory diseases: Secondary | ICD-10-CM | POA: Diagnosis not present

## 2017-10-21 DIAGNOSIS — R51 Headache: Secondary | ICD-10-CM | POA: Diagnosis not present

## 2017-10-21 DIAGNOSIS — Z888 Allergy status to other drugs, medicaments and biological substances status: Secondary | ICD-10-CM | POA: Diagnosis not present

## 2017-10-21 DIAGNOSIS — R42 Dizziness and giddiness: Secondary | ICD-10-CM | POA: Diagnosis not present

## 2017-10-21 DIAGNOSIS — R079 Chest pain, unspecified: Secondary | ICD-10-CM | POA: Diagnosis not present

## 2017-10-21 DIAGNOSIS — I951 Orthostatic hypotension: Secondary | ICD-10-CM | POA: Insufficient documentation

## 2017-10-21 DIAGNOSIS — R531 Weakness: Secondary | ICD-10-CM

## 2017-10-21 DIAGNOSIS — I700 Atherosclerosis of aorta: Secondary | ICD-10-CM | POA: Insufficient documentation

## 2017-10-21 DIAGNOSIS — K529 Noninfective gastroenteritis and colitis, unspecified: Secondary | ICD-10-CM | POA: Diagnosis present

## 2017-10-21 DIAGNOSIS — R1110 Vomiting, unspecified: Secondary | ICD-10-CM

## 2017-10-21 DIAGNOSIS — E871 Hypo-osmolality and hyponatremia: Secondary | ICD-10-CM | POA: Insufficient documentation

## 2017-10-21 HISTORY — DX: Essential (primary) hypertension: I10

## 2017-10-21 HISTORY — DX: Type 2 diabetes mellitus without complications: E11.9

## 2017-10-21 HISTORY — DX: Gastro-esophageal reflux disease without esophagitis: K21.9

## 2017-10-21 LAB — CBC
HEMATOCRIT: 40.4 % (ref 40.0–52.0)
HEMATOCRIT: 38.7 % — AB (ref 40.0–52.0)
HEMOGLOBIN: 14.1 g/dL (ref 13.0–18.0)
HEMOGLOBIN: 13.8 g/dL (ref 13.0–18.0)
MCH: 31.8 pg (ref 26.0–34.0)
MCH: 32.5 pg (ref 26.0–34.0)
MCHC: 34.8 g/dL (ref 32.0–36.0)
MCHC: 35.8 g/dL (ref 32.0–36.0)
MCV: 91.3 fL (ref 80.0–100.0)
MCV: 90.9 fL (ref 80.0–100.0)
PLATELETS: 199 10*3/uL (ref 150–440)
Platelets: 216 10*3/uL (ref 150–440)
RBC: 4.43 MIL/uL (ref 4.40–5.90)
RBC: 4.25 MIL/uL — AB (ref 4.40–5.90)
RDW: 14 % (ref 11.5–14.5)
RDW: 14.2 % (ref 11.5–14.5)
WBC: 10 10*3/uL (ref 3.8–10.6)
WBC: 8 10*3/uL (ref 3.8–10.6)

## 2017-10-21 LAB — HEPATIC FUNCTION PANEL
ALBUMIN: 3.7 g/dL (ref 3.5–5.0)
ALK PHOS: 34 U/L — AB (ref 38–126)
ALT: 26 U/L (ref 17–63)
AST: 34 U/L (ref 15–41)
Bilirubin, Direct: 0.2 mg/dL (ref 0.1–0.5)
Indirect Bilirubin: 0.8 mg/dL (ref 0.3–0.9)
Total Bilirubin: 1 mg/dL (ref 0.3–1.2)
Total Protein: 7 g/dL (ref 6.5–8.1)

## 2017-10-21 LAB — GASTROINTESTINAL PANEL BY PCR, STOOL (REPLACES STOOL CULTURE)
ASTROVIRUS: NOT DETECTED
Adenovirus F40/41: NOT DETECTED
CAMPYLOBACTER SPECIES: NOT DETECTED
Cryptosporidium: NOT DETECTED
Cyclospora cayetanensis: NOT DETECTED
ENTEROTOXIGENIC E COLI (ETEC): NOT DETECTED
Entamoeba histolytica: NOT DETECTED
Enteroaggregative E coli (EAEC): NOT DETECTED
Enteropathogenic E coli (EPEC): NOT DETECTED
Giardia lamblia: NOT DETECTED
NOROVIRUS GI/GII: NOT DETECTED
PLESIMONAS SHIGELLOIDES: NOT DETECTED
ROTAVIRUS A: DETECTED — AB
SAPOVIRUS (I, II, IV, AND V): NOT DETECTED
SHIGA LIKE TOXIN PRODUCING E COLI (STEC): NOT DETECTED
Salmonella species: NOT DETECTED
Shigella/Enteroinvasive E coli (EIEC): NOT DETECTED
VIBRIO CHOLERAE: NOT DETECTED
Vibrio species: NOT DETECTED
Yersinia enterocolitica: NOT DETECTED

## 2017-10-21 LAB — BASIC METABOLIC PANEL
ANION GAP: 16 — AB (ref 5–15)
ANION GAP: 13 (ref 5–15)
BUN: 18 mg/dL (ref 6–20)
BUN: 19 mg/dL (ref 6–20)
CHLORIDE: 94 mmol/L — AB (ref 101–111)
CHLORIDE: 97 mmol/L — AB (ref 101–111)
CO2: 23 mmol/L (ref 22–32)
CO2: 22 mmol/L (ref 22–32)
CREATININE: 1.02 mg/dL (ref 0.61–1.24)
Calcium: 8.1 mg/dL — ABNORMAL LOW (ref 8.9–10.3)
Calcium: 7.5 mg/dL — ABNORMAL LOW (ref 8.9–10.3)
Creatinine, Ser: 1.14 mg/dL (ref 0.61–1.24)
GFR calc non Af Amer: >60 mL/min (ref >60)
GFR calc non Af Amer: >60 mL/min (ref >60)
Glucose, Bld: 183 mg/dL — ABNORMAL HIGH (ref 65–99)
Glucose, Bld: 185 mg/dL — ABNORMAL HIGH (ref 65–99)
POTASSIUM: 2.9 mmol/L — AB (ref 3.5–5.1)
POTASSIUM: 2.8 mmol/L — AB (ref 3.5–5.1)
SODIUM: 133 mmol/L — AB (ref 135–145)
SODIUM: 132 mmol/L — AB (ref 135–145)

## 2017-10-21 LAB — MAGNESIUM: Magnesium: 0.9 mg/dL — CL (ref 1.7–2.4)

## 2017-10-21 LAB — GLUCOSE, CAPILLARY
GLUCOSE-CAPILLARY: 164 mg/dL — AB (ref 65–99)
Glucose-Capillary: 177 mg/dL — ABNORMAL HIGH (ref 65–99)

## 2017-10-21 LAB — LIPASE, BLOOD: LIPASE: 46 U/L (ref 11–51)

## 2017-10-21 LAB — TROPONIN I: Troponin I: <0.03 ng/mL (ref <0.03)

## 2017-10-21 MED ORDER — POTASSIUM CHLORIDE 20 MEQ PO PACK
40.0000 meq | PACK | Freq: Once | ORAL | Status: AC
  Administered 2017-10-21: 40 meq via ORAL
  Filled 2017-10-21: qty 2

## 2017-10-21 MED ORDER — ACETAMINOPHEN 650 MG RE SUPP: 650.0000 mg | Freq: Four times a day (QID) | RECTAL | Status: DC | PRN

## 2017-10-21 MED ORDER — POTASSIUM CHLORIDE CRYS ER 20 MEQ PO TBCR
20.0000 meq | EXTENDED_RELEASE_TABLET | Freq: Every day | ORAL | Status: DC
  Administered 2017-10-21 – 2017-10-22 (×2): 20 meq via ORAL
  Filled 2017-10-21 (×2): qty 1

## 2017-10-21 MED ORDER — INSULIN ASPART 100 UNIT/ML ~~LOC~~ SOLN
0.0000 [IU] | Freq: Three times a day (TID) | SUBCUTANEOUS | Status: DC
  Administered 2017-10-22: 3 [IU] via SUBCUTANEOUS
  Administered 2017-10-22: 2 [IU] via SUBCUTANEOUS
  Filled 2017-10-21 (×2): qty 1

## 2017-10-21 MED ORDER — MAGNESIUM SULFATE 2 GM/50ML IV SOLN
2.0000 g | Freq: Once | INTRAVENOUS | Status: AC
  Administered 2017-10-21: 2 g via INTRAVENOUS
  Filled 2017-10-21: qty 50

## 2017-10-21 MED ORDER — TOPIRAMATE 25 MG PO TABS
50.0000 mg | ORAL_TABLET | Freq: Two times a day (BID) | ORAL | Status: DC | PRN
Start: 2017-10-21 — End: 2017-10-22
  Filled 2017-10-21: qty 2

## 2017-10-21 MED ORDER — SODIUM CHLORIDE 0.9 % IV BOLUS
1000.0000 mL | Freq: Once | INTRAVENOUS | Status: AC
  Administered 2017-10-21: 1000 mL via INTRAVENOUS

## 2017-10-21 MED ORDER — POTASSIUM CHLORIDE CRYS ER 20 MEQ PO TBCR
40.0000 meq | EXTENDED_RELEASE_TABLET | Freq: Once | ORAL | Status: AC
  Administered 2017-10-21: 40 meq via ORAL
  Filled 2017-10-21: qty 2

## 2017-10-21 MED ORDER — HEPARIN SODIUM (PORCINE) 5000 UNIT/ML IJ SOLN
5000.0000 [IU] | Freq: Three times a day (TID) | INTRAMUSCULAR | Status: DC
  Administered 2017-10-21 – 2017-10-22 (×2): 5000 [IU] via SUBCUTANEOUS
  Filled 2017-10-21 (×2): qty 1

## 2017-10-21 MED ORDER — LISINOPRIL 20 MG PO TABS
20.0000 mg | ORAL_TABLET | Freq: Every day | ORAL | Status: DC
  Administered 2017-10-21 – 2017-10-22 (×2): 20 mg via ORAL
  Filled 2017-10-21 (×2): qty 1

## 2017-10-21 MED ORDER — FAMOTIDINE IN NACL 20-0.9 MG/50ML-% IV SOLN
20.0000 mg | Freq: Two times a day (BID) | INTRAVENOUS | Status: DC
  Administered 2017-10-21 – 2017-10-22 (×2): 20 mg via INTRAVENOUS
  Filled 2017-10-21 (×2): qty 50

## 2017-10-21 MED ORDER — ONDANSETRON HCL 4 MG PO TABS: 4.0000 mg | ORAL_TABLET | Freq: Four times a day (QID) | ORAL | Status: DC | PRN

## 2017-10-21 MED ORDER — INSULIN ASPART 100 UNIT/ML ~~LOC~~ SOLN: 0.0000 [IU] | Freq: Every day | SUBCUTANEOUS | Status: DC

## 2017-10-21 MED ORDER — FENOFIBRATE 54 MG PO TABS
54.0000 mg | ORAL_TABLET | Freq: Every day | ORAL | Status: DC
  Administered 2017-10-21 – 2017-10-22 (×2): 54 mg via ORAL
  Filled 2017-10-21 (×3): qty 1

## 2017-10-21 MED ORDER — ONDANSETRON HCL 4 MG/2ML IJ SOLN: 4.0000 mg | Freq: Four times a day (QID) | INTRAMUSCULAR | Status: DC | PRN

## 2017-10-21 MED ORDER — FLORANEX PO PACK
1.0000 g | PACK | Freq: Three times a day (TID) | ORAL | Status: DC
  Administered 2017-10-22 (×2): 1 g via ORAL
  Filled 2017-10-21 (×3): qty 1

## 2017-10-21 MED ORDER — POTASSIUM CHLORIDE 10 MEQ/100ML IV SOLN
10.0000 meq | Freq: Once | INTRAVENOUS | Status: AC
  Administered 2017-10-21: 10 meq via INTRAVENOUS
  Filled 2017-10-21: qty 100

## 2017-10-21 MED ORDER — GLIMEPIRIDE 2 MG PO TABS
4.0000 mg | ORAL_TABLET | Freq: Every day | ORAL | Status: DC
  Administered 2017-10-22: 4 mg via ORAL
  Filled 2017-10-21: qty 1
  Filled 2017-10-21: qty 2

## 2017-10-21 MED ORDER — ALLOPURINOL 100 MG PO TABS
300.0000 mg | ORAL_TABLET | Freq: Every day | ORAL | Status: DC
  Administered 2017-10-21 – 2017-10-22 (×2): 300 mg via ORAL
  Filled 2017-10-21 (×2): qty 3

## 2017-10-21 MED ORDER — MECLIZINE HCL 12.5 MG PO TABS
12.5000 mg | ORAL_TABLET | Freq: Three times a day (TID) | ORAL | Status: DC | PRN
  Filled 2017-10-21: qty 1

## 2017-10-21 MED ORDER — MAGNESIUM OXIDE 400 (241.3 MG) MG PO TABS
800.0000 mg | ORAL_TABLET | Freq: Every day | ORAL | Status: DC
  Administered 2017-10-21 – 2017-10-22 (×2): 800 mg via ORAL
  Filled 2017-10-21 (×2): qty 2

## 2017-10-21 MED ORDER — ACETAMINOPHEN 325 MG PO TABS: 650.0000 mg | ORAL_TABLET | Freq: Four times a day (QID) | ORAL | Status: DC | PRN

## 2017-10-21 MED ORDER — MECLIZINE HCL 25 MG PO TABS
25.0000 mg | ORAL_TABLET | Freq: Once | ORAL | Status: AC
  Administered 2017-10-21: 25 mg via ORAL

## 2017-10-21 MED ORDER — PRAMIPEXOLE DIHYDROCHLORIDE 0.25 MG PO TABS: 0.5000 mg | ORAL_TABLET | Freq: Every evening | ORAL | Status: DC | PRN

## 2017-10-21 MED ORDER — HYDRALAZINE HCL 20 MG/ML IJ SOLN: 10.0000 mg | INTRAMUSCULAR | Status: DC | PRN

## 2017-10-21 MED ORDER — POTASSIUM CHLORIDE IN NACL 40-0.9 MEQ/L-% IV SOLN
INTRAVENOUS | Status: DC
  Administered 2017-10-21: 75 mL/h via INTRAVENOUS
  Filled 2017-10-21 (×3): qty 1000

## 2017-10-21 NOTE — ED Notes (Addendum)
Pt states "I'm drunk but I don't drink. I'm swimmy headed." denies room spinning. States he went to [facility] this AM. States they gave him an IV and fluids. States they gave him 2 pills for dizziness. Went home and laid down. Started vomiting.   Alert, oriented, talking in complete sentences.

## 2017-10-21 NOTE — Progress Notes (Signed)
Family Meeting Note  Advance Directive:yes  Today a meeting took place with the Patient, wife, brother.  Patient is able to participate   The following clinical team members were present during this meeting:MD  The following were discussed:Patient's diagnosis: Diabetes, GERD, hypertension, gastroenteritis, multiple letter light abnormalities, chronic headache, Patient's progosis: Unable to determine and Goals for treatment: Full Code  Additional follow-up to be provided: prn  Time spent during discussion:20 minutes  Gorden Harms, MD

## 2017-10-21 NOTE — Discharge Instructions (Addendum)
Please make sure you are drinking lots of fluids.  Return to the urgent care facility for any dizziness, lightheadedness return of headache.  Return to the urgent care or ER for any worsening symptoms or urgent changes in your health.  You normally take one potassium tablet daily, for the next 2 days I would recommend you take 2 tablets daily to help increase her potassium.

## 2017-10-21 NOTE — ED Triage Notes (Signed)
Pt arrived via POV with reports of weakness and vomiting.  Pt was seen at [facility] for the same as well as dizziness. Pt states he laid down to take a nap and when he woke up he continued to feel sick and vomited.  Pt states he last vomited about 1 hour ago.  Vomited x 4 today.  Pt had K+ of 2.9 today at [facility].  Pt states he continues to feel nauseated at this time.

## 2017-10-21 NOTE — ED Provider Notes (Addendum)
[facility] Department Provider Note  Time seen: 4:55 PM  I have reviewed the triage vital signs and the nursing notes.   HISTORY  Chief Complaint Emesis; Weakness; and Shortness of Breath    HPI Ruben Foster is a 77 y.o. male with a past medical history of diabetes, gastric reflux, hypertension presents to the emergency department for nausea, vomiting, weakness and shortness of breath.  According to the patient for the past 2 days he has been feeling more weak and short of breath along with nausea vomiting and occasional loose stool.  Denies any abdominal pain.  Denies any chest pain.  No dysuria.  No fever.  Largely negative review of systems.  Patient states currently denies any nausea but does state he feels lightheaded and dizzy still.  Patient went to urgent care this morning and was referred to the emergency department for further evaluation.   Past Medical History:  Diagnosis Date  . Diabetes mellitus without complication ([place])   . GERD (gastroesophageal reflux disease)   . Hypertension     There are no active problems to display for this patient.   History reviewed. No pertinent surgical history.  Prior to Admission medications   Medication Sig Start Date End Date Taking? Authorizing Provider  allopurinol (ZYLOPRIM) 300 MG tablet Take by mouth. 08/04/17   [provider]  fenofibrate micronized (LOFIBRA) 134 MG capsule TAKE 1 CAPSULE (134 MG TOTAL) BY MOUTH ONCE DAILY. 04/04/16   [provider]  glimepiride (AMARYL) 4 MG tablet TAKE 1 TABLET (4 MG TOTAL) BY MOUTH DAILY WITH BREAKFAST. 09/15/17   [provider]  lisinopril-hydrochlorothiazide (PRINZIDE,ZESTORETIC) 20-25 MG tablet Take 1 tablet by mouth daily. 08/30/17   [provider]  metFORMIN (GLUCOPHAGE) 1000 MG tablet Take 1,000 mg by mouth 2 (two) times daily. 09/22/17   [provider]  pantoprazole (PROTONIX) 40 MG tablet Take by mouth.  08/23/17   [provider]  potassium chloride SA (KLOR-CON M20) 20 MEQ tablet TAKE 1 TABLET (20 MEQ TOTAL) BY MOUTH ONCE DAILY. 05/11/17   [provider]  pramipexole (MIRAPEX) 0.5 MG tablet Take by mouth. 02/27/17   [provider]    Allergies  Allergen Reactions  . Chlorthalidone Other (See Comments)  . Strawberry Extract Other (See Comments)    Family History  Problem Relation Age of Onset  . Emphysema Father     Social History Social History   Tobacco Use  . Smoking status: Current Every Day Smoker    Types: Pipe  . Smokeless tobacco: Never Used  Substance Use Topics  . Alcohol use: Not Currently    Frequency: Never  . Drug use: Not Currently    Review of Systems Constitutional: Negative for fever. Eyes: Negative for visual complaints ENT: Negative for recent illness/congestion Cardiovascular: Negative for chest pain. Respiratory: Negative for shortness of breath. Gastrointestinal: Negative for abdominal pain positive for nausea vomiting as well as intermittent diarrhea since yesterday. Genitourinary: Negative for urinary compaints Musculoskeletal: Negative for musculoskeletal complaints Skin: Negative for skin complaints  Neurological: Negative for headache All other ROS negative  ____________________________________________   PHYSICAL EXAM:  VITAL SIGNS: ED Triage Vitals  Enc Vitals Group     BP 10/21/17 1553 130/63     Pulse Rate 10/21/17 1553 (!) 101     Resp 10/21/17 1553 18     Temp 10/21/17 1553 98.6 F (37 C)     Temp Source 10/21/17 1553 Oral  SpO2 10/21/17 1553 95 %     Weight 10/21/17 1555 204 lb (92.5 kg)     Height 10/21/17 1555 6\' 1"  (1.854 m)     Head Circumference --      Peak Flow --      Pain Score 10/21/17 1555 4     Pain Loc --      Pain Edu? --      Excl. in [place]? --    Constitutional: Alert and oriented. Well appearing and in no distress. Eyes: Normal exam ENT   Head: Normocephalic and  atraumatic.   Mouth/Throat: Mucous membranes are moist. Cardiovascular: Normal rate, regular rhythm. No murmur Respiratory: Normal respiratory effort without tachypnea nor retractions. Breath sounds are clear Gastrointestinal: Soft and nontender.  Mild distention with tympanic percussion. Musculoskeletal: Nontender with normal range of motion in all extremities Neurologic:  Normal speech and language. No gross focal neurologic deficits Skin:  Skin is warm, dry and intact.  Psychiatric: Mood and affect are normal.  ____________________________________________  RADIOLOGY  IMPRESSION: No active cardiopulmonary disease.  Calcific atherosclerotic disease of the aorta.  ____________________________________________   INITIAL IMPRESSION / ASSESSMENT AND PLAN / ED COURSE  Pertinent labs & imaging results that were available during my care of the patient were reviewed by me and considered in my medical decision making (see chart for details).  Patient presents to the emergency department for nausea vomiting weakness and diarrhea.  Patient states symptoms have been ongoing for 2 days.  Differential would include gastroenteritis, enteritis, gastritis, intra-abdominal pathology, partial small bowel obstruction.  Will obtain x-ray imaging of the abdomen as well as blood work, urinalysis.  We will continue to closely monitor the patient in the emergency department, IV hydrate while awaiting results.   Labs have resulted with a magnesium of 0.9, potassium of 2.8.  We will begin IV potassium and magnesium repletion.  Given the patient's ongoing nausea and vomiting I have offered admission to the hospital.  Patient agreeable to admission.  EKG reviewed and interpreted by myself shows sinus tachycardia at 105 bpm with a widened QRS, left axis deviation, prolonged QTC, nonspecific ST changes  ____________________________________________   FINAL CLINICAL IMPRESSION(S) / ED  DIAGNOSES  Weakness Nausea vomiting Hypokalemia Hypomagnesemia   Harvest Dark, MD 10/21/17 Renato Shin, MD 11/08/17 [phone]

## 2017-10-21 NOTE — ED Notes (Signed)
hospitalist in to see pt.

## 2017-10-21 NOTE — ED Triage Notes (Signed)
Pt also states that he is having some tightness in his chest and is short of breath

## 2017-10-21 NOTE — ED Provider Notes (Addendum)
[facility]    CSN: [id number] Arrival date & time: 10/21/17  4098     History   Chief Complaint Chief Complaint  Patient presents with  . Dizziness    HPI Ruben Foster is a 77 y.o. male.   Presents to the urgent care facility for evaluation of headache and dizziness.  Patient states for 2 days he has been slightly dizzy and having a headache.  This headache feels similar to his normal headaches but is lasting longer than normal.  He denies any trauma or injury and is not on blood thinners.  His headache is generalized and he describes it as 7 out of 10 ache.  Headache was gradual with onset.  He denies any vision changes, weakness and wife notes nose signs of confusion or slurred speech.  Patient has also had some dizziness that he notes that is mild and only with movement.  He states when he stands for 2 to 3 minutes he will be slightly dizzy and then dizziness will resolve.  Patient describes his dizziness as the room spinning.  He denies any chest pain, shortness of breath.  No nausea vomiting or diaphoresis.  Wife states patient has not been eating or drinking much over the last 2 days.  HPI  History reviewed. No pertinent past medical history.  There are no active problems to display for this patient.   History reviewed. No pertinent surgical history.     Home Medications    Prior to Admission medications   Medication Sig Start Date End Date Taking? Authorizing Provider  allopurinol (ZYLOPRIM) 300 MG tablet Take by mouth. 08/04/17  Yes [provider]  fenofibrate micronized (LOFIBRA) 134 MG capsule TAKE 1 CAPSULE (134 MG TOTAL) BY MOUTH ONCE DAILY. 04/04/16  Yes [provider]  glimepiride (AMARYL) 4 MG tablet TAKE 1 TABLET (4 MG TOTAL) BY MOUTH DAILY WITH BREAKFAST. 09/15/17  Yes [provider]  lisinopril-hydrochlorothiazide (PRINZIDE,ZESTORETIC) 20-25 MG tablet Take 1 tablet by mouth daily. 08/30/17  Yes [provider]    metFORMIN (GLUCOPHAGE) 1000 MG tablet Take 1,000 mg by mouth 2 (two) times daily. 09/22/17  Yes [provider]  pantoprazole (PROTONIX) 40 MG tablet Take by mouth. 08/23/17  Yes [provider]  potassium chloride SA (KLOR-CON M20) 20 MEQ tablet TAKE 1 TABLET (20 MEQ TOTAL) BY MOUTH ONCE DAILY. 05/11/17  Yes [provider]  pramipexole (MIRAPEX) 0.5 MG tablet Take by mouth. 02/27/17  Yes [provider]    Family History Family History  Problem Relation Age of Onset  . Emphysema Father     Social History Social History   Tobacco Use  . Smoking status: Current Every Day Smoker    Types: Pipe  . Smokeless tobacco: Never Used  Substance Use Topics  . Alcohol use: Not Currently    Frequency: Never  . Drug use: Not Currently     Allergies   Chlorthalidone and Strawberry extract   Review of Systems Review of Systems  Constitutional: Negative.  Negative for activity change, appetite change, chills and fever.  HENT: Negative for congestion, ear pain, mouth sores, rhinorrhea, sinus pressure, sore throat and trouble swallowing.   Eyes: Negative for photophobia, pain and discharge.  Respiratory: Negative for cough, chest tightness and shortness of breath.   Cardiovascular: Negative for chest pain and leg swelling.  Gastrointestinal: Negative for abdominal distention, abdominal pain, diarrhea, nausea and vomiting.  Genitourinary: Negative for difficulty urinating and dysuria.  Musculoskeletal: Negative for  arthralgias, back pain and gait problem.  Skin: Negative for color change and rash.  Neurological: Positive for dizziness and headaches. Negative for syncope.  Hematological: Negative for adenopathy.  Psychiatric/Behavioral: Negative for agitation and behavioral problems.     Physical Exam Triage Vital Signs ED Triage Vitals  Enc Vitals Group     BP 10/21/17 1013 125/73     Pulse Rate 10/21/17 1013 (!) 103     Resp 10/21/17 1013 17      Temp 10/21/17 1013 97.9 F (36.6 C)     Temp Source 10/21/17 1013 Oral     SpO2 10/21/17 1013 97 %     Weight 10/21/17 1013 204 lb (92.5 kg)     Height 10/21/17 1013 6\' 1"  (1.854 m)     Head Circumference --      Peak Flow --      Pain Score 10/21/17 1012 4     Pain Loc --      Pain Edu? --      Excl. in [place]? --    Orthostatic VS for the past 24 hrs:  BP- Lying Pulse- Lying BP- Sitting Pulse- Sitting BP- Standing at 0 minutes Pulse- Standing at 0 minutes  10/21/17 1049 122/70 96 117/71 101 104/69 107    Updated Vital Signs BP 125/73 (BP Location: Left Arm)   Pulse (!) 103   Temp 97.9 F (36.6 C) (Oral)   Resp 17   Ht 6\' 1"  (1.854 m)   Wt 204 lb (92.5 kg)   SpO2 97%   BMI 26.91 kg/m   Visual Acuity Right Eye Distance:   Left Eye Distance:   Bilateral Distance:    Right Eye Near:   Left Eye Near:    Bilateral Near:     Physical Exam  Constitutional: He is oriented to person, place, and time. He appears well-developed and well-nourished.  HENT:  Head: Normocephalic and atraumatic.  Eyes: Pupils are equal, round, and reactive to light. Conjunctivae and EOM are normal.  Neck: Normal range of motion. Neck supple.  Cardiovascular: Normal rate, regular rhythm, normal heart sounds and intact distal pulses.  Pulmonary/Chest: Effort normal and breath sounds normal. No respiratory distress. He has no wheezes. He has no rales. He exhibits no tenderness.  Abdominal: Soft. Bowel sounds are normal. He exhibits no distension. There is no tenderness.  Musculoskeletal: Normal range of motion. He exhibits no edema or tenderness.  Neurological: He is alert and oriented to person, place, and time. He has normal strength. He is not disoriented. No cranial nerve deficit or sensory deficit. Coordination normal.  Skin: Skin is warm and dry.  Psychiatric: He has a normal mood and affect. His behavior is normal. Judgment and thought content normal.     UC Treatments / Results  Labs (all  labs ordered are listed, but only abnormal results are displayed) Labs Reviewed  BASIC METABOLIC PANEL - Abnormal; Notable for the following components:      Result Value   Sodium 133 (*)    Potassium 2.9 (*)    Chloride 94 (*)    Glucose, Bld 183 (*)    Calcium 8.1 (*)    Anion gap 16 (*)    All other components within normal limits  CBC    EKG ED ECG REPORT I, Feliberto Gottron, the attending physician, personally viewed and interpreted this ECG.   Date: 10/21/2017  EKG Time:1022  Rate: 99  Rhythm: normal EKG, normal sinus rhythm, RBBB  Axis:  [id number]  Intervals:right bundle branch block  ST&T Change: none No changes from previous EKG  Radiology No results found.  Procedures Procedures (including critical care time)  Medications Ordered in UC Medications  sodium chloride 0.9 % bolus 1,000 mL (0 mLs Intravenous Stopped 10/21/17 1149)  meclizine (ANTIVERT) tablet 25 mg (25 mg Oral Given 10/21/17 1101)    Initial Impression / Assessment and Plan / UC Course  I have reviewed the triage vital signs and the nursing notes.  Pertinent labs & imaging results that were available during my care of the patient were reviewed by me and considered in my medical decision making (see chart for details).     77 year old male with positional dizziness and headache.  He was noted to be slightly orthostatic upon initial evaluation.  He was given IV fluids and meclizine.  Patient's dizziness and headache completely resolved and post fluid orthostatic showed improvement.  Patient was able to stand ambulate and move around with no dizziness no headache.  Patient states his symptoms resolved 100%.  Laboratory evaluation showed he was hypokalemic with potassium of 2.9, he normally takes potassium 20 mEq daily and he was advised to increase this to 20 mEq twice daily for the next 2 days.  He is encouraged to eat and drink fluids.  He understands signs and symptoms to return to the urgent  care ED for. Final Clinical Impressions(s) / UC Diagnoses   Final diagnoses:  Dizziness  Hypokalemia  Orthostatic hypotension     Discharge Instructions     Please make sure you are drinking lots of fluids.  Return to the urgent care facility for any dizziness, lightheadedness return of headache.  Return to the urgent care or ER for any worsening symptoms or urgent changes in your health.  You normally take one potassium tablet daily, for the next 2 days I would recommend you take 2 tablets daily to help increase her potassium.   ED Prescriptions    None       Duanne Guess, PA-C 10/21/17 1200    Duanne Guess, [place] 10/21/17 1208

## 2017-10-21 NOTE — ED Notes (Signed)
Report from kate, rn.  

## 2017-10-21 NOTE — ED Notes (Signed)
Pt ambulatory to toilet by self to have a BM

## 2017-10-21 NOTE — ED Triage Notes (Addendum)
Patient complains of dizziness that started yesterday. Patient states that it seems like the room is spinning. Patient states that he has had a aggravating headache with this.

## 2017-10-21 NOTE — H&P (Signed)
[facility] [facility] [place] NAME: [name]    MR#:  [id number]  DATE OF BIRTH:  08/21/1940  DATE OF ADMISSION:  10/21/2017  PRIMARY CARE PHYSICIAN: Patient, No Pcp Per   REQUESTING/REFERRING PHYSICIAN:   CHIEF COMPLAINT:   Chief Complaint  Patient presents with  . Emesis  . Weakness  . Shortness of Breath    HISTORY OF PRESENT ILLNESS: Ruben Foster  is a 77 y.o. male with a known history per below presents to the emergency room with 1 to 2-day history of worsening nausea, emesis, chronic loose stool per patient which he believes is due to metformin use, presented originally to urgent care with the symptomatology earlier in the day, was discharged, continued to complain of lightheadedness, dizziness, acute on chronic headaches, generalized weakness, fatigue, shortness of breath, in the emergency room patient was found to have mild tachycardia, tachypnea, multiple electrolyte abnormalities on chemistry, acute abdominal series unimpressive, CBC normal, patient evaluated emergency room, multiple family members at the bedside, patient denied any sick contacts, patient is most likely being admitted for acute gastroenteritis with multiple electrolyte abnormalities which includes hyponatremia/hypokalemia/hypochloremia/hypomagnesemia.  PAST MEDICAL HISTORY:   Past Medical History:  Diagnosis Date  . Diabetes mellitus without complication ([place])   . GERD (gastroesophageal reflux disease)   . Hypertension     PAST SURGICAL HISTORY: History reviewed. No pertinent surgical history.  SOCIAL HISTORY:  Social History   Tobacco Use  . Smoking status: Current Every Day Smoker    Types: Pipe  . Smokeless tobacco: Never Used  Substance Use Topics  . Alcohol use: Not Currently    Frequency: Never    FAMILY HISTORY:  Family History  Problem Relation Age of Onset  . Emphysema Father     DRUG ALLERGIES:  Allergies  Allergen Reactions  .  Chlorthalidone Other (See Comments)  . Strawberry Extract Other (See Comments)    REVIEW OF SYSTEMS:   CONSTITUTIONAL: No fever,+ fatigue, weakness.  EYES: No blurred or double vision.  EARS, NOSE, AND THROAT: No tinnitus or ear pain.  RESPIRATORY: No cough, +shortness of breath, no wheezing or hemoptysis.  CARDIOVASCULAR: No chest pain, orthopnea, edema.  GASTROINTESTINAL:+ nausea, vomiting, and chronic loose stools   GENITOURINARY: No dysuria, hematuria.  ENDOCRINE: No polyuria, nocturia,  HEMATOLOGY: No anemia, easy bruising or bleeding SKIN: No rash or lesion. MUSCULOSKELETAL: No joint pain or arthritis.   NEUROLOGIC: No tingling, numbness, weakness. + Dizziness PSYCHIATRY: No anxiety or depression.   MEDICATIONS AT HOME:  Prior to Admission medications   Medication Sig Start Date End Date Taking? Authorizing Provider  allopurinol (ZYLOPRIM) 300 MG tablet Take 300 mg by mouth daily.  08/04/17  Yes [provider]  fenofibrate micronized (LOFIBRA) 134 MG capsule TAKE 1 CAPSULE (134 MG TOTAL) BY MOUTH ONCE DAILY. 04/04/16  Yes [provider]  glimepiride (AMARYL) 4 MG tablet TAKE 1 TABLET (4 MG TOTAL) BY MOUTH DAILY WITH BREAKFAST. 09/15/17  Yes [provider]  lisinopril-hydrochlorothiazide (PRINZIDE,ZESTORETIC) 20-25 MG tablet Take 1 tablet by mouth daily. 08/30/17  Yes [provider]  metFORMIN (GLUCOPHAGE) 1000 MG tablet Take 1,000 mg by mouth 2 (two) times daily. 09/22/17  Yes [provider]  pantoprazole (PROTONIX) 40 MG tablet Take 40 mg by mouth 2 (two) times daily.  08/23/17  Yes [provider]  potassium chloride SA (KLOR-CON M20) 20 MEQ tablet TAKE 1 TABLET (20 MEQ TOTAL) BY MOUTH ONCE DAILY. 05/11/17  Yes [provider]  pramipexole (MIRAPEX) 0.5 MG tablet Take 0.5 mg by mouth at bedtime as needed (restless leg syndrome).  02/27/17  Yes [provider]      PHYSICAL EXAMINATION:   VITAL SIGNS: Blood  pressure 140/72, pulse 93, temperature 98.6 F (37 C), temperature source Oral, resp. rate (!) 25, height 6\' 1"  (1.854 m), weight 92.5 kg (204 lb), SpO2 97 %.  GENERAL:  77 y.o.-year-old patient lying in the bed with no acute distress.  EYES: Pupils equal, round, reactive to light and accommodation. No scleral icterus. Extraocular muscles intact.  HEENT: Head atraumatic, normocephalic. Oropharynx and nasopharynx clear.  NECK:  Supple, no jugular venous distention. No thyroid enlargement, no tenderness.  LUNGS: Normal breath sounds bilaterally, no wheezing, rales,rhonchi or crepitation. No use of accessory muscles of respiration.  CARDIOVASCULAR: S1, S2 normal. No murmurs, rubs, or gallops.  ABDOMEN: Soft, nontender, nondistended. Bowel sounds present. No organomegaly or mass.  EXTREMITIES: No pedal edema, cyanosis, or clubbing.  NEUROLOGIC: Cranial nerves II through XII are intact. Muscle strength 5/5 in all extremities. Sensation intact. Gait not checked.  PSYCHIATRIC: The patient is alert and oriented x 3.  SKIN: No obvious rash, lesion, or ulcer.   LABORATORY PANEL:   CBC Recent Labs  Lab 10/21/17 1058 10/21/17 1603  WBC 10.0 8.0  HGB 14.1 13.8  HCT 40.4 38.7*  PLT 216 199  MCV 91.3 90.9  MCH 31.8 32.5  MCHC 34.8 35.8  RDW 14.0 14.2   ------------------------------------------------------------------------------------------------------------------  Chemistries  Recent Labs  Lab 10/21/17 1058 10/21/17 1603  NA 133* 132*  K 2.9* 2.8*  CL 94* 97*  CO2 23 22  GLUCOSE 183* 185*  BUN 18 19  CREATININE 1.14 1.02  CALCIUM 8.1* 7.5*  MG  --  0.9*  AST  --  34  ALT  --  26  ALKPHOS  --  34*  BILITOT  --  1.0   ------------------------------------------------------------------------------------------------------------------ estimated creatinine clearance is 69.6 mL/min (by C-G formula based on SCr of 1.02  mg/dL). ------------------------------------------------------------------------------------------------------------------ No results for input(s): TSH, T4TOTAL, T3FREE, THYROIDAB in the last 72 hours.  Invalid input(s): FREET3   Coagulation profile No results for input(s): INR, PROTIME in the last 168 hours. ------------------------------------------------------------------------------------------------------------------- No results for input(s): DDIMER in the last 72 hours. -------------------------------------------------------------------------------------------------------------------  Cardiac Enzymes Recent Labs  Lab 10/21/17 1603  TROPONINI <0.03   ------------------------------------------------------------------------------------------------------------------ Invalid input(s): POCBNP  ---------------------------------------------------------------------------------------------------------------  Urinalysis    Component Value Date/Time   COLORURINE AMBER 01/28/2013 1620   APPEARANCEUR HAZY 01/28/2013 1620   LABSPEC SEE COMMENT 01/28/2013 1620   PHURINE see comment 01/28/2013 1620   GLUCOSEU see comment 01/28/2013 1620   HGBUR see comment 01/28/2013 1620   BILIRUBINUR SEE COMMENT 01/28/2013 1620   KETONESUR SEE COMMENT 01/28/2013 1620   PROTEINUR SEE COMMENT 01/28/2013 1620   NITRITE SEE COMMENT 01/28/2013 1620   LEUKOCYTESUR SEE COMMENT 01/28/2013 1620     RADIOLOGY: Dg Chest 2 View  Result Date: 10/21/2017 CLINICAL DATA:  Dizziness and emesis today. EXAM: CHEST - 2 VIEW COMPARISON:  06/13/2013 FINDINGS: Cardiomediastinal silhouette is normal. Mediastinal contours appear intact. Calcific atherosclerotic disease of the aorta. There is no evidence of focal airspace consolidation, pleural effusion or pneumothorax. Osseous structures are without acute abnormality. Soft tissues are grossly normal. IMPRESSION: No active cardiopulmonary disease. Calcific atherosclerotic  disease of the aorta. Electronically Signed   By: Fidela Salisbury M.D.   On: 10/21/2017 16:46   Dg Abd 2 Views  Result Date: 10/21/2017 CLINICAL DATA:  Weakness and vomiting. EXAM: ABDOMEN - 2 VIEW COMPARISON:  None. FINDINGS: The bowel gas pattern is normal. There is no evidence of free air. No radio-opaque calculi or other significant radiographic abnormality is seen. IMPRESSION: Negative. Electronically Signed   By: Fidela Salisbury M.D.   On: 10/21/2017 17:42    EKG: Orders placed or performed during the hospital encounter of 10/21/17  . ED EKG within 10 minutes  . ED EKG within 10 minutes    IMPRESSION AND PLAN: *Acute probable gastroenteritis *Acute multiple electrolyte abnormalities-hypokalemia/hyponatremia/hypochloremia/hypomagnesemia *Chronic diabetes mellitus type 2 *GERD, chronic  Admit to observation unit, replete electrolyte abnormalities with IV fluids/p.o. potassium/p.o. magnesium, IV fluids for rehydration, antiemetics PRN, check GI panel, contact precautions, fall precautions, and continue close medical monitoring    All the records are reviewed and case discussed with ED provider. Management plans discussed with the patient, family and they are in agreement.  CODE STATUS:full   TOTAL TIME TAKING CARE OF THIS PATIENT: 45 minutes.    Avel Peace Earvin Blazier M.D on 10/21/2017   Between 7am to 6pm - Pager - [phone]  After 6pm go to [email or web address] - password [email or web address] [facility]  Office  [phone]  CC: Primary care physician; Patient, No Pcp Per   Note: This dictation was prepared with Dragon dictation along with smaller phrase technology. Any transcriptional errors that result from this process are unintentional.

## 2017-10-21 NOTE — ED Notes (Signed)
Date and time results received: 10/21/17 1721  Test: magnesium Critical Value: 0.09  Name of Provider Notified: Dr. Kerman Passey

## 2017-10-22 DIAGNOSIS — K529 Noninfective gastroenteritis and colitis, unspecified: Secondary | ICD-10-CM | POA: Diagnosis not present

## 2017-10-22 DIAGNOSIS — A080 Rotaviral enteritis: Secondary | ICD-10-CM | POA: Diagnosis not present

## 2017-10-22 DIAGNOSIS — E876 Hypokalemia: Secondary | ICD-10-CM | POA: Diagnosis not present

## 2017-10-22 DIAGNOSIS — E119 Type 2 diabetes mellitus without complications: Secondary | ICD-10-CM | POA: Diagnosis not present

## 2017-10-22 LAB — BASIC METABOLIC PANEL
Anion gap: 8 (ref 5–15)
BUN: 18 mg/dL (ref 6–20)
CALCIUM: 7 mg/dL — AB (ref 8.9–10.3)
CHLORIDE: 104 mmol/L (ref 101–111)
CO2: 22 mmol/L (ref 22–32)
CREATININE: 1.07 mg/dL (ref 0.61–1.24)
GFR calc non Af Amer: >60 mL/min (ref >60)
Glucose, Bld: 173 mg/dL — ABNORMAL HIGH (ref 65–99)
Potassium: 3.5 mmol/L (ref 3.5–5.1)
SODIUM: 134 mmol/L — AB (ref 135–145)

## 2017-10-22 LAB — URINALYSIS, COMPLETE (UACMP) WITH MICROSCOPIC
BACTERIA UA: NONE SEEN
Bilirubin Urine: NEGATIVE
Glucose, UA: NEGATIVE mg/dL
Ketones, ur: 5 mg/dL — AB
Leukocytes, UA: NEGATIVE
Nitrite: NEGATIVE
Protein, ur: NEGATIVE mg/dL
SPECIFIC GRAVITY, URINE: 1.021 (ref 1.005–1.030)
Squamous Epithelial / LPF: NONE SEEN (ref 0–5)
pH: 5 (ref 5.0–8.0)

## 2017-10-22 LAB — GLUCOSE, CAPILLARY
GLUCOSE-CAPILLARY: 153 mg/dL — AB (ref 65–99)
Glucose-Capillary: 124 mg/dL — ABNORMAL HIGH (ref 65–99)

## 2017-10-22 MED ORDER — FLORANEX PO PACK: 1.0000 g | PACK | Freq: Three times a day (TID) | ORAL | 0 refills | Status: AC

## 2017-10-22 MED ORDER — FAMOTIDINE 20 MG PO TABS: 20.0000 mg | ORAL_TABLET | Freq: Two times a day (BID) | ORAL | Status: DC

## 2017-10-22 NOTE — Care Management Obs Status (Signed)
[facility] NOTIFICATION   Patient Details  Name: Ruben Foster MRN: [id number] Date of Birth: 1940/11/11   Medicare Observation Status Notification Given:  Yes    Jolly Mango, RN 10/22/2017, 12:20 PM

## 2017-10-22 NOTE — Clinical Social Work Note (Signed)
CSW received a consult that the patient needs a new PCP. Please consult RNCM for this need. CSW is signing off. Please consult should needs arise.  Santiago Bumpers, MSW, Latanya Presser [phone]

## 2017-10-22 NOTE — Discharge Summary (Signed)
[place] [facility] [place] NAME: Ruben Foster    MR#:  [id number]  DATE OF BIRTH:  06/25/1940  DATE OF ADMISSION:  10/21/2017 ADMITTING PHYSICIAN: Gorden Harms, MD  DATE OF DISCHARGE: Oct 22, 2017  PRIMARY CARE PHYSICIAN: Alanson Aly, FNP    ADMISSION DIAGNOSIS:  Hypokalemia [E87.6] Hypomagnesemia [E83.42] Emesis [R11.10] Weakness [R53.1]  DISCHARGE DIAGNOSIS:  Active Problems:   Gastroenteritis   SECONDARY DIAGNOSIS:   Past Medical History:  Diagnosis Date  . Diabetes mellitus without complication ([place])   . GERD (gastroesophageal reflux disease)   . Hypertension     HOSPITAL COURSE:  77 year old male with a history of diabetes who presents with diarrhea.  1.  Rotavirus gastroenteritis: Rotavirus is etiology of patient's diarrhea. He feels symptomatically improved Patient would like to go home today. Patient was asked to sanitize his house and hand hygiene was discussed.  2.  Diabetes: Patient will continue outpatient medications.  Patient has been taking metformin for over 10 years.  Metformin can cause diarrhea but this is not the etiology of this current episode of diarrhea.  3.  Essential hypertension: Patient will continue lisinopril/HCTZ  4.  Hyponatremia: This improved with IV fluids.  This was related to diarrhea  5.  Hypokalemia related to diarrhea which is improved with replacement   DISCHARGE CONDITIONS AND DIET:   Stable for discharge on diabetic diet  CONSULTS OBTAINED:    DRUG ALLERGIES:   Allergies  Allergen Reactions  . Chlorthalidone Other (See Comments)  . Strawberry Extract Other (See Comments)    DISCHARGE MEDICATIONS:   Allergies as of 10/22/2017      Reactions   Chlorthalidone Other (See Comments)   Strawberry Extract Other (See Comments)      Medication List    TAKE these medications   allopurinol 300 MG tablet Commonly known as:  ZYLOPRIM Take 300 mg by mouth daily.    fenofibrate micronized 134 MG capsule Commonly known as:  LOFIBRA TAKE 1 CAPSULE (134 MG TOTAL) BY MOUTH ONCE DAILY.   glimepiride 4 MG tablet Commonly known as:  AMARYL TAKE 1 TABLET (4 MG TOTAL) BY MOUTH DAILY WITH BREAKFAST.   KLOR-CON M20 20 MEQ tablet Generic drug:  potassium chloride SA TAKE 1 TABLET (20 MEQ TOTAL) BY MOUTH ONCE DAILY.   lactobacillus Pack Take 1 packet (1 g total) by mouth 3 (three) times daily with meals for 7 days.   lisinopril-hydrochlorothiazide 20-25 MG tablet Commonly known as:  PRINZIDE,ZESTORETIC Take 1 tablet by mouth daily.   metFORMIN 1000 MG tablet Commonly known as:  GLUCOPHAGE Take 1,000 mg by mouth 2 (two) times daily.   pantoprazole 40 MG tablet Commonly known as:  PROTONIX Take 40 mg by mouth 2 (two) times daily.   pramipexole 0.5 MG tablet Commonly known as:  MIRAPEX Take 0.5 mg by mouth at bedtime as needed (restless leg syndrome).         Today   CHIEF COMPLAINT:   Patient reports diarrhea has improved and is tolerating his diet   VITAL SIGNS:  Blood pressure 130/76, pulse 88, temperature 97.9 F (36.6 C), temperature source Oral, resp. rate 20, height 6\' 1"  (1.854 m), weight 90.7 kg (200 lb), SpO2 99 %.   REVIEW OF SYSTEMS:  Review of Systems  Constitutional: Negative.  Negative for chills, fever and malaise/fatigue.  HENT: Negative.  Negative for ear discharge, ear pain, hearing loss, nosebleeds and sore throat.   Eyes: Negative.  Negative for  blurred vision and pain.  Respiratory: Negative.  Negative for cough, hemoptysis, shortness of breath and wheezing.   Cardiovascular: Negative.  Negative for chest pain, palpitations and leg swelling.  Gastrointestinal: Positive for diarrhea (improving). Negative for abdominal pain, blood in stool, nausea and vomiting.  Genitourinary: Negative.  Negative for dysuria.  Musculoskeletal: Negative.  Negative for back pain.  Skin: Negative.   Neurological: Negative for  dizziness, tremors, speech change, focal weakness, seizures and headaches.  Endo/Heme/Allergies: Negative.  Does not bruise/bleed easily.  Psychiatric/Behavioral: Negative.  Negative for depression, hallucinations and suicidal ideas.     PHYSICAL EXAMINATION:  GENERAL:  77 y.o.-year-old patient lying in the bed with no acute distress.  NECK:  Supple, no jugular venous distention. No thyroid enlargement, no tenderness.  LUNGS: Normal breath sounds bilaterally, no wheezing, rales,rhonchi  No use of accessory muscles of respiration.  CARDIOVASCULAR: S1, S2 normal. No murmurs, rubs, or gallops.  ABDOMEN: Soft, non-tender, non-distended. Bowel sounds present. No organomegaly or mass.  EXTREMITIES: No pedal edema, cyanosis, or clubbing.  PSYCHIATRIC: The patient is alert and oriented x 3.  SKIN: No obvious rash, lesion, or ulcer.   DATA REVIEW:   CBC Recent Labs  Lab 10/21/17 1603  WBC 8.0  HGB 13.8  HCT 38.7*  PLT 199    Chemistries  Recent Labs  Lab 10/21/17 1603 10/22/17 0612  NA 132* 134*  K 2.8* 3.5  CL 97* 104  CO2 22 22  GLUCOSE 185* 173*  BUN 19 18  CREATININE 1.02 1.07  CALCIUM 7.5* 7.0*  MG 0.9*  --   AST 34  --   ALT 26  --   ALKPHOS 34*  --   BILITOT 1.0  --     Cardiac Enzymes Recent Labs  Lab 10/21/17 1603  TROPONINI <0.03    Microbiology Results  @MICRORSLT48 @  RADIOLOGY:  Dg Chest 2 View  Result Date: 10/21/2017 CLINICAL DATA:  Dizziness and emesis today. EXAM: CHEST - 2 VIEW COMPARISON:  06/13/2013 FINDINGS: Cardiomediastinal silhouette is normal. Mediastinal contours appear intact. Calcific atherosclerotic disease of the aorta. There is no evidence of focal airspace consolidation, pleural effusion or pneumothorax. Osseous structures are without acute abnormality. Soft tissues are grossly normal. IMPRESSION: No active cardiopulmonary disease. Calcific atherosclerotic disease of the aorta. Electronically Signed   By: Fidela Salisbury M.D.    On: 10/21/2017 16:46   Dg Abd 2 Views  Result Date: 10/21/2017 CLINICAL DATA:  Weakness and vomiting. EXAM: ABDOMEN - 2 VIEW COMPARISON:  None. FINDINGS: The bowel gas pattern is normal. There is no evidence of free air. No radio-opaque calculi or other significant radiographic abnormality is seen. IMPRESSION: Negative. Electronically Signed   By: Fidela Salisbury M.D.   On: 10/21/2017 17:42      Allergies as of 10/22/2017      Reactions   Chlorthalidone Other (See Comments)   Strawberry Extract Other (See Comments)      Medication List    TAKE these medications   allopurinol 300 MG tablet Commonly known as:  ZYLOPRIM Take 300 mg by mouth daily.   fenofibrate micronized 134 MG capsule Commonly known as:  LOFIBRA TAKE 1 CAPSULE (134 MG TOTAL) BY MOUTH ONCE DAILY.   glimepiride 4 MG tablet Commonly known as:  AMARYL TAKE 1 TABLET (4 MG TOTAL) BY MOUTH DAILY WITH BREAKFAST.   KLOR-CON M20 20 MEQ tablet Generic drug:  potassium chloride SA TAKE 1 TABLET (20 MEQ TOTAL) BY MOUTH ONCE DAILY.   lactobacillus Pack  Take 1 packet (1 g total) by mouth 3 (three) times daily with meals for 7 days.   lisinopril-hydrochlorothiazide 20-25 MG tablet Commonly known as:  PRINZIDE,ZESTORETIC Take 1 tablet by mouth daily.   metFORMIN 1000 MG tablet Commonly known as:  GLUCOPHAGE Take 1,000 mg by mouth 2 (two) times daily.   pantoprazole 40 MG tablet Commonly known as:  PROTONIX Take 40 mg by mouth 2 (two) times daily.   pramipexole 0.5 MG tablet Commonly known as:  MIRAPEX Take 0.5 mg by mouth at bedtime as needed (restless leg syndrome).          Management plans discussed with the patient and he is in agreement. Stable for discharge home  Patient should follow up with pcp  CODE STATUS:     Code Status Orders  (From admission, onward)        Start     Ordered   10/21/17 2006  Full code  Continuous     10/21/17 2005    Code Status History    This patient has a  current code status but no historical code status.      TOTAL TIME TAKING CARE OF THIS PATIENT: 38 minutes.    Note: This dictation was prepared with Dragon dictation along with smaller phrase technology. Any transcriptional errors that result from this process are unintentional.  Jerrie Gullo M.D on 10/22/2017 at 8:51 AM  Between 7am to 6pm - Pager - [phone] After 6pm go to [email or web address] - password [email or web address] [facility]  Office  [phone]  CC: Primary care physician; Alanson Aly, [place]

## 2017-10-22 NOTE — Progress Notes (Signed)
PHARMACIST - PHYSICIAN COMMUNICATION  CONCERNING: IV to Oral Route Change Policy  RECOMMENDATION: This patient is receiving famotidine by the intravenous route.  Based on criteria approved by the Pharmacy and Therapeutics Committee, the intravenous medication(s) is/are being converted to the equivalent oral dose form(s).   DESCRIPTION: These criteria include:  The patient is eating (either orally or via tube) and/or has been taking other orally administered medications for a least 24 hours  The patient has no evidence of active gastrointestinal bleeding or impaired GI absorption (gastrectomy, short bowel, patient on TNA or NPO).  If you have questions about this conversion, please contact the Pharmacy Department  []   [phone] )  [facility] [x]   [phone] )  [facility] []   [phone] )  [facility] []   [phone] )  [facility] []   [phone] )  [place], [facility] 10/22/2017 10:44 AM

## 2018-02-22 ENCOUNTER — Encounter: Payer: Self-pay | Admitting: Emergency Medicine

## 2018-02-22 ENCOUNTER — Other Ambulatory Visit: Payer: Self-pay

## 2018-02-22 ENCOUNTER — Emergency Department
Admission: EM | Admit: 2018-02-22 | Discharge: 2018-02-22 | Disposition: A | Discharge status: Home / Self Care | Payer: PPO | Attending: Emergency Medicine | Admitting: Emergency Medicine

## 2018-02-22 DIAGNOSIS — E119 Type 2 diabetes mellitus without complications: Secondary | ICD-10-CM | POA: Diagnosis not present

## 2018-02-22 DIAGNOSIS — F17290 Nicotine dependence, other tobacco product, uncomplicated: Secondary | ICD-10-CM | POA: Diagnosis not present

## 2018-02-22 DIAGNOSIS — N478 Other disorders of prepuce: Secondary | ICD-10-CM

## 2018-02-22 DIAGNOSIS — N471 Phimosis: Secondary | ICD-10-CM | POA: Diagnosis not present

## 2018-02-22 DIAGNOSIS — I10 Essential (primary) hypertension: Secondary | ICD-10-CM | POA: Diagnosis not present

## 2018-02-22 DIAGNOSIS — R1909 Other intra-abdominal and pelvic swelling, mass and lump: Secondary | ICD-10-CM | POA: Diagnosis present

## 2018-02-22 NOTE — Discharge Instructions (Addendum)
Turn to the emergency department if you develop pain or burning with urination, fever, nausea or vomiting, or any other symptoms concerning to you.

## 2018-02-22 NOTE — ED Notes (Signed)
Per pt report pt's foreskin is "shrinking." Pt appears in NAD. No other complaints at this time

## 2018-02-22 NOTE — ED Provider Notes (Signed)
[facility] Emergency Department Provider Note  ____________________________________________  Time seen: Approximately 3:13 PM  I have reviewed the triage vital signs and the nursing notes.   HISTORY  Chief Complaint Groin Swelling    HPI Ruben Foster is a 77 y.o. male with a history of HTN, DM, and prior history of penile fracture presenting for inability to retract his foreskin.  The patient reports that for the past 2 to 3 months, he has noticed that he is no longer able to retract his foreskin.  He does not have any pain, swelling, erythema or discharge from the penis.  He has no testicular or scrotal pain.  He is most concerned that his urinary stream can "shoot in any direction" and that his wife has to "mop up after me."  He is not having any dysuria, urinary frequency, hematuria, fever, nausea or vomiting.  No bleeding or recent injury.  Past Medical History:  Diagnosis Date  . Diabetes mellitus without complication ([place])   . GERD (gastroesophageal reflux disease)   . Hypertension     Patient Active Problem List   Diagnosis Date Noted  . Gastroenteritis 10/21/2017    History reviewed. No pertinent surgical history.  Current Outpatient Rx  . Order #: [id number] Class: Historical Med  . Order #: [id number] Class: Historical Med  . Order #: [id number] Class: Historical Med  . Order #: [id number] Class: Historical Med  . Order #: [id number] Class: Historical Med  . Order #: [id number] Class: Historical Med  . Order #: [id number] Class: Historical Med  . Order #: [id number] Class: Historical Med    Allergies Chlorthalidone and Strawberry extract  Family History  Problem Relation Age of Onset  . Emphysema Father     Social History Social History   Tobacco Use  . Smoking status: Current Every Day Smoker    Types: Pipe  . Smokeless tobacco: Never Used  Substance Use Topics  . Alcohol use: Not Currently    Frequency: Never  . Drug use: Not  Currently    Review of Systems Constitutional: No fever/chills. ENT: No congestion or rhinorrhea. Gastrointestinal: No abdominal pain.  No nausea, no vomiting.  No diarrhea.  No constipation. Genitourinary: Negative for dysuria.  Negative for hematuria.  Negative for urinary frequency.  Negative for scrotal or testicular pain.  Positive for inability to retract the foreskin.  Positive for uncontrolled urinary stream direction Musculoskeletal: Negative for back pain. Skin: Negative for rash. Neurological: No difficulty walking.  No urinary incontinence. ____________________________________________   PHYSICAL EXAM:  VITAL SIGNS: ED Triage Vitals [02/22/18 1151]  Enc Vitals Group     BP (!) 158/99     Pulse Rate (!) 105     Resp 16     Temp 98.2 F (36.8 C)     Temp Source Oral     SpO2 96 %     Weight 214 lb (97.1 kg)     Height 6\' 1"  (1.854 m)     Head Circumference      Peak Flow      Pain Score 0     Pain Loc      Pain Edu?      Excl. in [place]?     Constitutional: Alert and oriented. Well appearing and in no acute distress. Answers questions appropriately. Eyes: Conjunctivae are normal.  EOMI. No scleral icterus. Head: Atraumatic. Nose: No congestion/rhinnorhea. Mouth/Throat: Mucous membranes are moist.  Neck: No stridor.  Supple.   Cardiovascular: Normal rate Respiratory: Normal respiratory effort. . Gastrointestinal: Soft,  nontender and nondistended.  No guarding or rebound.  No peritoneal signs. Genitourinary: The patient is uncircumcised and has foreskin that cannot be retracted over the glans.  There is no surrounding discharge, evidence of candidal infection, erythema, or pain.  There are no lesions on the penis.  The left testicle hangs lower than the right, and the left testicle is larger than the right.  No palpable inguinal hernias. Musculoskeletal: No LE edema. Neurologic:  A&Ox3.  Speech is clear.  Face and smile are symmetric.  EOMI.  Moves all extremities  well. Skin:  Skin is warm, dry and intact. No rash noted. Psychiatric: Mood and affect are normal. Speech and behavior are normal.  Normal judgement.  ____________________________________________   LABS (all labs ordered are listed, but only abnormal results are displayed)  Labs Reviewed - No data to display ____________________________________________  EKG  Not indicated ____________________________________________  RADIOLOGY  No results found.  ____________________________________________   PROCEDURES  Procedure(s) performed: None  Procedures  Critical Care performed: No ____________________________________________   INITIAL IMPRESSION / ASSESSMENT AND PLAN / ED COURSE  Pertinent labs & imaging results that were available during my care of the patient were reviewed by me and considered in my medical decision making (see chart for details).  77 y.o. male with a prior history of penile fracture presenting with inability to retract his foreskin.  The patient does appear to have a physiologic phimosis on my examination, but there is no evidence for an acute infection, any swelling, or trauma.  It is possible that he has contracture of the skin and will need to see a urologist for decompression.  There is no indication for an emergency consult today.  The patient is not having any signs or symptoms of UTI.  At this time, the patient is safe for discharge home.  I have gotten him an appointment to see the urologist at 10 AM tomorrow morning.  Follow-up instructions as well as return precautions were discussed with the patient and his wife.  ____________________________________________  FINAL CLINICAL IMPRESSION(S) / ED DIAGNOSES  Final diagnoses:  Foreskin does not retract         NEW MEDICATIONS STARTED DURING THIS VISIT:  New Prescriptions   No medications on file      Eula Listen, MD 02/22/18 1526

## 2018-02-22 NOTE — ED Triage Notes (Addendum)
Patient reports "my foreskin is shrinking". Reports having trouble with urination. States "I can't control where the urine goes." Patient not wanting to go into detail with this RN. Denies fevers. Reports history of surgery on penis. Denies pain. Patient states he was sent by UC for urology referral since it would be December before he could get in anywhere.

## 2018-02-23 ENCOUNTER — Ambulatory Visit: Payer: PPO | Admitting: Urology

## 2018-02-23 ENCOUNTER — Encounter: Payer: Self-pay | Admitting: Urology

## 2018-02-23 VITALS — BP 145/79 | HR 105 | Ht 73.0 in | Wt 206.8 lb

## 2018-02-23 DIAGNOSIS — E781 Pure hyperglyceridemia: Secondary | ICD-10-CM | POA: Insufficient documentation

## 2018-02-23 DIAGNOSIS — E1140 Type 2 diabetes mellitus with diabetic neuropathy, unspecified: Secondary | ICD-10-CM | POA: Insufficient documentation

## 2018-02-23 DIAGNOSIS — E119 Type 2 diabetes mellitus without complications: Secondary | ICD-10-CM | POA: Insufficient documentation

## 2018-02-23 DIAGNOSIS — N471 Phimosis: Secondary | ICD-10-CM | POA: Diagnosis not present

## 2018-02-23 NOTE — Progress Notes (Signed)
02/23/2018 10:59 AM   Ruben Foster 1941/01/04 [id number]  Referring provider: Alanson Aly, FNP No address on file  Chief Complaint  Patient presents with  . Other    HPI: 77 year old male presents complaining of inability to retract his foreskin.  A few weeks ago he noted tightness of his prepuce and stated it "split" when he tried to retract.  He subsequently was unable to retract over the next several weeks.  He complains of spraying of his urinary stream due to his redundant prepuce.  He was having no voiding problems when he was able to retract.  He does have diabetes.  He has seen Dr. Yves Dill in the past for ED and Peyronie's disease and states he was told to "live with it".  He complained of downward curvature of the penis however he currently states he is not having any spontaneous erections.   PMH: Past Medical History:  Diagnosis Date  . Diabetes mellitus without complication ([place])   . GERD (gastroesophageal reflux disease)   . Hypertension     Surgical History: No past surgical history on file.  Home Medications:  Allergies as of 02/23/2018      Reactions   Chlorthalidone Other (See Comments)   Strawberry Extract Other (See Comments)      Medication List        Accurate as of 02/23/18 10:59 AM. Always use your most recent med list.          allopurinol 300 MG tablet Commonly known as:  ZYLOPRIM Take 300 mg by mouth daily.   fenofibrate micronized 134 MG capsule Commonly known as:  LOFIBRA TAKE 1 CAPSULE (134 MG TOTAL) BY MOUTH ONCE DAILY.   glimepiride 4 MG tablet Commonly known as:  AMARYL TAKE 1 TABLET (4 MG TOTAL) BY MOUTH DAILY WITH BREAKFAST.   KLOR-CON M20 20 MEQ tablet Generic drug:  potassium chloride SA TAKE 1 TABLET (20 MEQ TOTAL) BY MOUTH ONCE DAILY.   lisinopril-hydrochlorothiazide 20-25 MG tablet Commonly known as:  PRINZIDE,ZESTORETIC Take 1 tablet by mouth daily.   metFORMIN 1000 MG tablet Commonly known as:   GLUCOPHAGE Take 1,000 mg by mouth 2 (two) times daily.   pantoprazole 40 MG tablet Commonly known as:  PROTONIX Take 40 mg by mouth 2 (two) times daily.   pramipexole 0.5 MG tablet Commonly known as:  MIRAPEX Take 0.5 mg by mouth at bedtime as needed (restless leg syndrome).       Allergies:  Allergies  Allergen Reactions  . Chlorthalidone Other (See Comments)  . Strawberry Extract Other (See Comments)    Family History: Family History  Problem Relation Age of Onset  . Emphysema Father     Social History:  reports that he has been smoking pipe. He has never used smokeless tobacco. He reports that he drank alcohol. He reports that he has current or past drug history.  ROS: UROLOGY Frequent Urination?: Yes Hard to postpone urination?: No Burning/pain with urination?: No Get up at night to urinate?: Yes Leakage of urine?: No Urine stream starts and stops?: No Trouble starting stream?: No Do you have to strain to urinate?: No Blood in urine?: No Urinary tract infection?: No Sexually transmitted disease?: No Injury to kidneys or bladder?: No Painful intercourse?: No Weak stream?: No Erection problems?: Yes Penile pain?: No  Gastrointestinal Nausea?: No Vomiting?: No Indigestion/heartburn?: No Diarrhea?: No Constipation?: No  Constitutional Fever: No Night sweats?: No Weight loss?: No Fatigue?: No  Skin Skin rash/lesions?: No Itching?: No  Eyes Blurred vision?: No Double vision?: No  Ears/Nose/Throat Sore throat?: No Sinus problems?: No  Hematologic/Lymphatic Swollen glands?: No Easy bruising?: No  Cardiovascular Leg swelling?: No Chest pain?: No  Respiratory Cough?: No Shortness of breath?: No  Endocrine Excessive thirst?: No  Musculoskeletal Back pain?: No Joint pain?: No  Neurological Headaches?: No Dizziness?: No  Psychologic Depression?: No Anxiety?: No  Physical Exam: BP (!) 145/79 (BP Location: Left Arm, Patient  Position: Sitting, Cuff Size: Large)   Pulse (!) 105   Ht 6\' 1"  (1.854 m)   Wt 206 lb 12.8 oz (93.8 kg)   BMI 27.28 kg/m   Constitutional:  Alert and oriented, No acute distress. HEENT: [place] AT, moist mucus membranes.  Trachea midline, no masses. Cardiovascular: No clubbing, cyanosis, or edema.  RRR Respiratory: Normal respiratory effort, no increased work of breathing.  Lungs clear GI: Abdomen is soft, nontender, nondistended, no abdominal masses GU: No CVA tenderness.  Phallus with a fairly long prepuce with inability to retract.  The meatus is visualized and is normal.  Testes descended bilateral without masses or tenderness Lymph: No cervical or inguinal lymphadenopathy. Skin: No rashes, bruises or suspicious lesions. Neurologic: Grossly intact, no focal deficits, moving all 4 extremities. Psychiatric: Normal mood and affect.   Assessment & Plan:   77 year old male with phimosis.  We discussed options of circumcision versus dorsal slit.  The pros and cons of each procedure were discussed.  He would like to schedule circumcision.  The procedure was discussed in detail including potential risks of bleeding, infection and scarring.  He indicated all questions were answered and desires to proceed.   Abbie Sons, [place] [address] [place], [place] [address] [phone]

## 2018-03-01 ENCOUNTER — Telehealth: Payer: Self-pay | Admitting: Urology

## 2018-03-01 NOTE — Telephone Encounter (Signed)
Dr Bernardo Heater - will need orders

## 2018-03-01 NOTE — Telephone Encounter (Signed)
Patient and his wife came into the office today inquiring about his surgery.  He saw Dr. Bernardo Heater on 02/23/18 and discussed a circumcision.    Please call him at [phone].

## 2018-03-02 ENCOUNTER — Other Ambulatory Visit: Payer: Self-pay | Admitting: Radiology

## 2018-03-02 NOTE — Telephone Encounter (Signed)
Orders completed 10/4

## 2018-03-08 ENCOUNTER — Other Ambulatory Visit: Payer: Self-pay | Admitting: Radiology

## 2018-03-08 DIAGNOSIS — N471 Phimosis: Secondary | ICD-10-CM

## 2018-03-22 ENCOUNTER — Encounter
Admission: RE | Admit: 2018-03-22 | Discharge: 2018-03-22 | Disposition: A | Discharge status: Home / Self Care | Payer: PPO | Source: Ambulatory Visit | Attending: Urology | Admitting: Urology

## 2018-03-22 ENCOUNTER — Other Ambulatory Visit: Payer: Self-pay

## 2018-03-22 DIAGNOSIS — Z01812 Encounter for preprocedural laboratory examination: Secondary | ICD-10-CM | POA: Insufficient documentation

## 2018-03-22 HISTORY — DX: Gout, unspecified: M10.9

## 2018-03-22 HISTORY — DX: Restless legs syndrome: G25.81

## 2018-03-22 LAB — CBC
HEMATOCRIT: 38.8 % — AB (ref 39.0–52.0)
HEMOGLOBIN: 13.4 g/dL (ref 13.0–17.0)
MCH: 31.5 pg (ref 26.0–34.0)
MCHC: 34.5 g/dL (ref 30.0–36.0)
MCV: 91.3 fL (ref 80.0–100.0)
Platelets: 233 10*3/uL (ref 150–400)
RBC: 4.25 MIL/uL (ref 4.22–5.81)
RDW: 13.2 % (ref 11.5–15.5)
WBC: 7.1 10*3/uL (ref 4.0–10.5)
nRBC: 0 % (ref 0.0–0.2)

## 2018-03-22 LAB — BASIC METABOLIC PANEL
Anion gap: 15 (ref 5–15)
BUN: 12 mg/dL (ref 8–23)
CHLORIDE: 97 mmol/L — AB (ref 98–111)
CO2: 24 mmol/L (ref 22–32)
Calcium: 9.1 mg/dL (ref 8.9–10.3)
Creatinine, Ser: 1.15 mg/dL (ref 0.61–1.24)
GFR calc Af Amer: >60 mL/min (ref >60)
GFR calc non Af Amer: 60 mL/min — ABNORMAL LOW (ref >60)
GLUCOSE: 168 mg/dL — AB (ref 70–99)
POTASSIUM: 3.2 mmol/L — AB (ref 3.5–5.1)
Sodium: 136 mmol/L (ref 135–145)

## 2018-03-22 NOTE — Patient Instructions (Signed)
Your procedure is scheduled on: 03/27/18 Tues Report to Same Day Surgery 2nd floor medical mall [facility] Entrance-take elevator on left to 2nd floor.  Check in with surgery information desk.) To find out your arrival time please call [phone] between 1PM - 3PM on 03/26/18 Mon  Remember: Instructions that are not followed completely may result in serious medical risk, up to and including death, or upon the discretion of your surgeon and anesthesiologist your surgery may need to be rescheduled.    _x___ 1. Do not eat food after midnight the night before your procedure. You may drink clear liquids up to 2 hours before you are scheduled to arrive at the hospital for your procedure.  Do not drink clear liquids within 2 hours of your scheduled arrival to the hospital.  Clear liquids include  --Water or Apple juice without pulp  --Clear carbohydrate beverage such as ClearFast or Gatorade  --Black Coffee or Clear Tea (No milk, no creamers, do not add anything to                  the coffee or Tea Type 1 and type 2 diabetics should only drink water.   ____Ensure clear carbohydrate drink on the way to the hospital for bariatric patients  ____Ensure clear carbohydrate drink 3 hours before surgery for Dr Dwyane Luo patients if physician instructed.   No gum chewing or hard candies.     __x__ 2. No Alcohol for 24 hours before or after surgery.   __x__3. No Smoking or e-cigarettes for 24 prior to surgery.  Do not use any chewable tobacco products for at least 6 hour prior to surgery   ____  4. Bring all medications with you on the day of surgery if instructed.    __x__ 5. Notify your doctor if there is any change in your medical condition     (cold, fever, infections).    x___6. On the morning of surgery brush your teeth with toothpaste and water.  You may rinse your mouth with mouth wash if you wish.  Do not swallow any toothpaste or mouthwash.   Do not wear jewelry, make-up, hairpins,  clips or nail polish.  Do not wear lotions, powders, or perfumes. You may wear deodorant.  Do not shave 48 hours prior to surgery. Men may shave face and neck.  Do not bring valuables to the hospital.    [facility] is not responsible for any belongings or valuables.               Contacts, dentures or bridgework may not be worn into surgery.  Leave your suitcase in the car. After surgery it may be brought to your room.  For patients admitted to the hospital, discharge time is determined by your                       treatment team.  _  Patients discharged the day of surgery will not be allowed to drive home.  You will need someone to drive you home and stay with you the night of your procedure.    Please read over the following fact sheets that you were given:   [facility] Preparing for Surgery and or MRSA Information   _x___ Take anti-hypertensive listed below, cardiac, seizure, asthma,     anti-reflux and psychiatric medicines. These include:  1. allopurinol (ZYLOPRIM) 300 MG tablet  2.pantoprazole (PROTONIX) 40 MG tablet  3.  4.  5.  6.  ____Fleets enema or Magnesium Citrate as directed.   _x___ Use CHG Soap or sage wipes as directed on instruction sheet   ____ Use inhalers on the day of surgery and bring to hospital day of surgery  __x_ Stop Metformin and Janumet 2 days prior to surgery.    ____ Take 1/2 of usual insulin dose the night before surgery and none on the morning     surgery.   _x___ Follow recommendations from Cardiologist, Pulmonologist or PCP regarding          stopping Aspirin, Coumadin, Plavix ,Eliquis, Effient, or Pradaxa, and Pletal.  X____Stop Anti-inflammatories such as Advil, Aleve, Ibuprofen, Motrin, Naproxen, Naprosyn, Goodies powders or aspirin products. OK to take Tylenol and                          Celebrex.   _x___ Stop supplements until after surgery.  But may continue Vitamin D, Vitamin B,       and multivitamin.   ____ Bring C-Pap to the  hospital.

## 2018-03-26 MED ORDER — CEFAZOLIN SODIUM-DEXTROSE 2-4 GM/100ML-% IV SOLN: 2.0000 g | INTRAVENOUS | Status: DC

## 2018-03-27 ENCOUNTER — Ambulatory Visit: Payer: PPO | Admitting: Certified Registered"

## 2018-03-27 ENCOUNTER — Encounter
Admission: RE | Disposition: A | Discharge status: Home / Self Care | Payer: Self-pay | Source: Ambulatory Visit | Attending: Urology

## 2018-03-27 ENCOUNTER — Other Ambulatory Visit: Payer: Self-pay

## 2018-03-27 ENCOUNTER — Ambulatory Visit
Admission: RE | Admit: 2018-03-27 | Discharge: 2018-03-27 | Disposition: A | Discharge status: Home / Self Care | Payer: PPO | Source: Ambulatory Visit | Attending: Urology | Admitting: Urology

## 2018-03-27 DIAGNOSIS — K219 Gastro-esophageal reflux disease without esophagitis: Secondary | ICD-10-CM | POA: Insufficient documentation

## 2018-03-27 DIAGNOSIS — Z7984 Long term (current) use of oral hypoglycemic drugs: Secondary | ICD-10-CM | POA: Diagnosis not present

## 2018-03-27 DIAGNOSIS — F17290 Nicotine dependence, other tobacco product, uncomplicated: Secondary | ICD-10-CM | POA: Insufficient documentation

## 2018-03-27 DIAGNOSIS — N471 Phimosis: Secondary | ICD-10-CM | POA: Insufficient documentation

## 2018-03-27 DIAGNOSIS — Z888 Allergy status to other drugs, medicaments and biological substances status: Secondary | ICD-10-CM | POA: Diagnosis not present

## 2018-03-27 DIAGNOSIS — Z79899 Other long term (current) drug therapy: Secondary | ICD-10-CM | POA: Diagnosis not present

## 2018-03-27 DIAGNOSIS — I10 Essential (primary) hypertension: Secondary | ICD-10-CM | POA: Insufficient documentation

## 2018-03-27 DIAGNOSIS — Q5561 Curvature of penis (lateral): Secondary | ICD-10-CM | POA: Insufficient documentation

## 2018-03-27 DIAGNOSIS — E119 Type 2 diabetes mellitus without complications: Secondary | ICD-10-CM | POA: Diagnosis not present

## 2018-03-27 HISTORY — PX: CIRCUMCISION: SHX1350

## 2018-03-27 LAB — POCT I-STAT 4, (NA,K, GLUC, HGB,HCT)
Glucose, Bld: 183 mg/dL — ABNORMAL HIGH (ref 70–99)
HCT: 39 % (ref 39.0–52.0)
HEMOGLOBIN: 13.3 g/dL (ref 13.0–17.0)
Potassium: 3.4 mmol/L — ABNORMAL LOW (ref 3.5–5.1)
Sodium: 138 mmol/L (ref 135–145)

## 2018-03-27 LAB — GLUCOSE, CAPILLARY
Glucose-Capillary: 171 mg/dL — ABNORMAL HIGH (ref 70–99)
Glucose-Capillary: 184 mg/dL — ABNORMAL HIGH (ref 70–99)

## 2018-03-27 SURGERY — CIRCUMCISION, ADULT
Anesthesia: General | Site: Penis

## 2018-03-27 MED ORDER — ONDANSETRON HCL 4 MG/2ML IJ SOLN
INTRAMUSCULAR | Status: DC | PRN
  Administered 2018-03-27: 4 mg via INTRAVENOUS

## 2018-03-27 MED ORDER — PROPOFOL 10 MG/ML IV BOLUS
INTRAVENOUS | Status: AC
  Filled 2018-03-27: qty 40

## 2018-03-27 MED ORDER — ACETAMINOPHEN 10 MG/ML IV SOLN
INTRAVENOUS | Status: AC
  Filled 2018-03-27: qty 100

## 2018-03-27 MED ORDER — OXYCODONE HCL 5 MG PO TABS
ORAL_TABLET | ORAL | Status: AC
  Filled 2018-03-27: qty 1

## 2018-03-27 MED ORDER — ACETAMINOPHEN 10 MG/ML IV SOLN
INTRAVENOUS | Status: DC | PRN
  Administered 2018-03-27: 1000 mg via INTRAVENOUS

## 2018-03-27 MED ORDER — LIDOCAINE HCL (PF) 2 % IJ SOLN
INTRAMUSCULAR | Status: AC
  Filled 2018-03-27: qty 10

## 2018-03-27 MED ORDER — HYDROCODONE-ACETAMINOPHEN 5-325 MG PO TABS: 1.0000 | ORAL_TABLET | ORAL | 0 refills | Status: DC | PRN

## 2018-03-27 MED ORDER — PROMETHAZINE HCL 25 MG/ML IJ SOLN: 6.2500 mg | INTRAMUSCULAR | Status: DC | PRN

## 2018-03-27 MED ORDER — SODIUM CHLORIDE 0.9 % IV SOLN
INTRAVENOUS | Status: DC
  Administered 2018-03-27: 10:00:00 via INTRAVENOUS

## 2018-03-27 MED ORDER — PROPOFOL 10 MG/ML IV BOLUS: INTRAVENOUS | Status: DC | PRN

## 2018-03-27 MED ORDER — FENTANYL CITRATE (PF) 100 MCG/2ML IJ SOLN
25.0000 ug | INTRAMUSCULAR | Status: DC | PRN
  Administered 2018-03-27 (×3): 25 ug via INTRAVENOUS

## 2018-03-27 MED ORDER — ONDANSETRON HCL 4 MG/2ML IJ SOLN
INTRAMUSCULAR | Status: AC
  Filled 2018-03-27: qty 2

## 2018-03-27 MED ORDER — FENTANYL CITRATE (PF) 100 MCG/2ML IJ SOLN
INTRAMUSCULAR | Status: AC
  Filled 2018-03-27: qty 2

## 2018-03-27 MED ORDER — OXYCODONE HCL 5 MG PO TABS
5.0000 mg | ORAL_TABLET | Freq: Once | ORAL | Status: AC | PRN
  Administered 2018-03-27: 5 mg via ORAL

## 2018-03-27 MED ORDER — BUPIVACAINE HCL 0.25 % IJ SOLN
INTRAMUSCULAR | Status: DC | PRN
  Administered 2018-03-27: 6 mL

## 2018-03-27 MED ORDER — OXYCODONE HCL 5 MG/5ML PO SOLN: 5.0000 mg | Freq: Once | ORAL | Status: AC | PRN

## 2018-03-27 MED ORDER — PROPOFOL 500 MG/50ML IV EMUL: INTRAVENOUS | Status: DC | PRN

## 2018-03-27 MED ORDER — FENTANYL CITRATE (PF) 100 MCG/2ML IJ SOLN
INTRAMUSCULAR | Status: DC | PRN
  Administered 2018-03-27 (×2): 25 ug via INTRAVENOUS
  Administered 2018-03-27: 50 ug via INTRAVENOUS
  Administered 2018-03-27: 25 ug via INTRAVENOUS
  Administered 2018-03-27: 50 ug via INTRAVENOUS

## 2018-03-27 MED ORDER — FENTANYL CITRATE (PF) 100 MCG/2ML IJ SOLN
INTRAMUSCULAR | Status: AC
  Administered 2018-03-27: 25 ug via INTRAVENOUS
  Filled 2018-03-27: qty 2

## 2018-03-27 MED ORDER — PROPOFOL 10 MG/ML IV BOLUS
INTRAVENOUS | Status: DC | PRN
  Administered 2018-03-27: 160 mg via INTRAVENOUS
  Administered 2018-03-27: 40 mg via INTRAVENOUS

## 2018-03-27 MED ORDER — MEPERIDINE HCL 50 MG/ML IJ SOLN: 6.2500 mg | INTRAMUSCULAR | Status: DC | PRN

## 2018-03-27 MED ORDER — LIDOCAINE HCL (CARDIAC) PF 100 MG/5ML IV SOSY
PREFILLED_SYRINGE | INTRAVENOUS | Status: DC | PRN
  Administered 2018-03-27: 60 mg via INTRAVENOUS

## 2018-03-27 MED ORDER — CEFAZOLIN SODIUM-DEXTROSE 2-4 GM/100ML-% IV SOLN
INTRAVENOUS | Status: AC
  Filled 2018-03-27: qty 100

## 2018-03-27 MED ORDER — BACITRACIN 500 UNIT/GM EX OINT
TOPICAL_OINTMENT | CUTANEOUS | Status: DC | PRN
  Administered 2018-03-27: 1 via TOPICAL

## 2018-03-27 SURGICAL SUPPLY — 27 items
BLADE CLIPPER SURG (BLADE) ×3 IMPLANT
BLADE SURG 15 STRL LF DISP TIS (BLADE) ×1 IMPLANT
BLADE SURG 15 STRL SS (BLADE) ×2
BNDG COHESIVE 1X5 TAN NS LF (GAUZE/BANDAGES/DRESSINGS) ×3 IMPLANT
CANISTER SUCT 1200ML W/VALVE (MISCELLANEOUS) ×3 IMPLANT
CHLORAPREP W/TINT 26ML (MISCELLANEOUS) ×3 IMPLANT
COVER WAND RF STERILE (DRAPES) ×3 IMPLANT
DRAPE LAPAROTOMY 77X122 PED (DRAPES) ×3 IMPLANT
ELECT REM PT RETURN 9FT ADLT (ELECTROSURGICAL) ×3
ELECTRODE REM PT RTRN 9FT ADLT (ELECTROSURGICAL) ×1 IMPLANT
GAUZE PETROLATUM 1 X8 (GAUZE/BANDAGES/DRESSINGS) ×3 IMPLANT
GAUZE STRETCH 2X75IN STRL (MISCELLANEOUS) ×3 IMPLANT
GLOVE BIO SURGEON STRL SZ8 (GLOVE) ×3 IMPLANT
GOWN STRL REUS W/ TWL LRG LVL3 (GOWN DISPOSABLE) ×2 IMPLANT
GOWN STRL REUS W/TWL LRG LVL3 (GOWN DISPOSABLE) ×4
KIT TURNOVER KIT A (KITS) ×3 IMPLANT
LABEL OR SOLS (LABEL) ×3 IMPLANT
NEEDLE HYPO 25X1 1.5 SAFETY (NEEDLE) ×3 IMPLANT
NS IRRIG 500ML POUR BTL (IV SOLUTION) ×3 IMPLANT
PACK BASIN MINOR ARMC (MISCELLANEOUS) ×3 IMPLANT
SOL PREP PVP 2OZ (MISCELLANEOUS) ×3
SOLUTION PREP PVP 2OZ (MISCELLANEOUS) ×1 IMPLANT
STRETCH NET 2 107126 (MISCELLANEOUS) ×3 IMPLANT
SUT CHROMIC 3 0 SH 27 (SUTURE) ×6 IMPLANT
SUT CHROMIC 4 0 RB 1X27 (SUTURE) ×3 IMPLANT
SUT CHROMIC 4 0 SH 27 (SUTURE) ×3 IMPLANT
SYR 10ML LL (SYRINGE) ×3 IMPLANT

## 2018-03-27 NOTE — Anesthesia Procedure Notes (Signed)
Procedure Name: LMA Insertion Performed by: Nakhia Levitan, CRNA Pre-anesthesia Checklist: Patient identified, Patient being monitored, Timeout performed, Emergency Drugs available and Suction available Patient Re-evaluated:Patient Re-evaluated prior to induction Oxygen Delivery Method: Circle system utilized Preoxygenation: Pre-oxygenation with 100% oxygen Induction Type: IV induction Ventilation: Mask ventilation without difficulty LMA: LMA inserted LMA Size: 4.5 Tube type: Oral Number of attempts: 1 Placement Confirmation: positive ETCO2 and breath sounds checked- equal and bilateral Tube secured with: Tape Dental Injury: Teeth and Oropharynx as per pre-operative assessment        

## 2018-03-27 NOTE — Progress Notes (Signed)
Ice pack to penis

## 2018-03-27 NOTE — Anesthesia Preprocedure Evaluation (Signed)
Anesthesia Evaluation  Patient identified by MRN, date of birth, ID band Patient awake    Reviewed: Allergy & Precautions, NPO status , Patient's Chart, lab work & pertinent test results  History of Anesthesia Complications Negative for: history of anesthetic complications  Airway Mallampati: II  TM Distance: >3 FB Neck ROM: Full    Dental  (+) Poor Dentition, Missing   Pulmonary neg sleep apnea, neg COPD, Current Smoker,    breath sounds clear to auscultation- rhonchi (-) wheezing      Cardiovascular hypertension, Pt. on medications (-) CAD, (-) Past MI, (-) Cardiac Stents and (-) CABG  Rhythm:Regular Rate:Normal - Systolic murmurs and - Diastolic murmurs    Neuro/Psych negative neurological ROS  negative psych ROS   GI/Hepatic Neg liver ROS, GERD  ,  Endo/Other  diabetes, Oral Hypoglycemic Agents  Renal/GU negative Renal ROS     Musculoskeletal negative musculoskeletal ROS (+)   Abdominal (+) - obese,   Peds  Hematology negative hematology ROS (+)   Anesthesia Other Findings Past Medical History: No date: Diabetes mellitus without complication (HCC) No date: GERD (gastroesophageal reflux disease) No date: Gout No date: Hypertension No date: Restless legs   Reproductive/Obstetrics                             Anesthesia Physical Anesthesia Plan  ASA: II  Anesthesia Plan: General   Post-op Pain Management:    Induction: Intravenous  PONV Risk Score and Plan: 0 and Ondansetron  Airway Management Planned: LMA  Additional Equipment:   Intra-op Plan:   Post-operative Plan:   Informed Consent: I have reviewed the patients History and Physical, chart, labs and discussed the procedure including the risks, benefits and alternatives for the proposed anesthesia with the patient or authorized representative who has indicated his/her understanding and acceptance.   Dental advisory  given  Plan Discussed with: CRNA and Anesthesiologist  Anesthesia Plan Comments:         Anesthesia Quick Evaluation

## 2018-03-27 NOTE — Progress Notes (Signed)
Only slight amt of drainage on dressing  Pt states pain is improved

## 2018-03-27 NOTE — Anesthesia Postprocedure Evaluation (Signed)
Anesthesia Post Note  Patient: Ruben Foster  Procedure(s) Performed: CIRCUMCISION ADULT (N/A Penis)  Patient location during evaluation: PACU Anesthesia Type: General Level of consciousness: awake and alert and oriented Pain management: pain level controlled Vital Signs Assessment: post-procedure vital signs reviewed and stable Respiratory status: spontaneous breathing, nonlabored ventilation and respiratory function stable Cardiovascular status: blood pressure returned to baseline and stable Postop Assessment: no signs of nausea or vomiting Anesthetic complications: no     Last Vitals:  Vitals:   03/27/18 1308 03/27/18 1410  BP: (!) 151/92 (!) 127/95  Pulse: 87 85  Resp: 16   Temp: (!) 36.2 C   SpO2: 99% 96%    Last Pain:  Vitals:   03/27/18 1410  TempSrc:   PainSc: 4                  Kimoni Pagliarulo

## 2018-03-27 NOTE — Interval H&P Note (Signed)
History and Physical Interval Note:  03/27/2018 10:18 AM  Ruben Foster  has presented today for surgery, with the diagnosis of Phimosis  The various methods of treatment have been discussed with the patient and family. After consideration of risks, benefits and other options for treatment, the patient has consented to  Procedure(s) with comments: CIRCUMCISION ADULT (N/A) - MAC & Local anesthesia as a surgical intervention .  The patient's history has been reviewed, patient examined, no change in status, stable for surgery.  I have reviewed the patient's chart and labs.  Questions were answered to the patient's satisfaction.     [place]

## 2018-03-27 NOTE — Transfer of Care (Signed)
Immediate Anesthesia Transfer of Care Note  Patient: Ruben Foster  Procedure(s) Performed: CIRCUMCISION ADULT (N/A Penis)  Patient Location: PACU  Anesthesia Type:General  Level of Consciousness: drowsy and responds to stimulation  Airway & Oxygen Therapy: Patient Spontanous Breathing and Patient connected to face mask oxygen  Post-op Assessment: Report given to RN and Post -op Vital signs reviewed and stable  Post vital signs: Reviewed and stable  Last Vitals:  Vitals Value Taken Time  BP 155/85 03/27/2018 11:50 AM  Temp 36.5 C 03/27/2018 11:50 AM  Pulse 102 03/27/2018 11:50 AM  Resp 15 03/27/2018 11:50 AM  SpO2 98 % 03/27/2018 11:50 AM    Last Pain:  Vitals:   03/27/18 0940  TempSrc: Temporal  PainSc: 0-No pain         Complications: No apparent anesthesia complications

## 2018-03-27 NOTE — Anesthesia Post-op Follow-up Note (Signed)
Anesthesia QCDR form completed.        

## 2018-03-27 NOTE — H&P (Signed)
03/27/2018 10:17 AM   Ruben Foster Dec 06, 1940 [id number]  Referring provider: Alanson Aly, [place]      Chief Complaint  Patient presents with  . Other    HPI: 77 year old male presents complaining of inability to retract his foreskin.  A few weeks ago he noted tightness of his prepuce and stated it "split" when he tried to retract.  He subsequently was unable to retract over the next several weeks.  He complains of spraying of his urinary stream due to his redundant prepuce.  He was having no voiding problems when he was able to retract.  He does have diabetes.  He has seen Dr. Yves Dill in the past for ED and Peyronie's disease and states he was told to "live with it".  He complained of downward curvature of the penis however he currently states he is not having any spontaneous erections.   PMH:     Past Medical History:  Diagnosis Date  . Diabetes mellitus without complication ([place])   . GERD (gastroesophageal reflux disease)   . Hypertension     Surgical History: No past surgical history on file.  Home Medications:       Allergies as of 02/23/2018      Reactions   Chlorthalidone Other (See Comments)   Strawberry Extract Other (See Comments)               Medication List            Accurate as of 02/23/18 10:59 AM. Always use your most recent med list.           allopurinol 300 MG tablet Commonly known as:  ZYLOPRIM Take 300 mg by mouth daily.   fenofibrate micronized 134 MG capsule Commonly known as:  LOFIBRA TAKE 1 CAPSULE (134 MG TOTAL) BY MOUTH ONCE DAILY.   glimepiride 4 MG tablet Commonly known as:  AMARYL TAKE 1 TABLET (4 MG TOTAL) BY MOUTH DAILY WITH BREAKFAST.   KLOR-CON M20 20 MEQ tablet Generic drug:  potassium chloride SA TAKE 1 TABLET (20 MEQ TOTAL) BY MOUTH ONCE DAILY.   lisinopril-hydrochlorothiazide 20-25 MG tablet Commonly known as:  PRINZIDE,ZESTORETIC Take 1 tablet by mouth daily.   metFORMIN  1000 MG tablet Commonly known as:  GLUCOPHAGE Take 1,000 mg by mouth 2 (two) times daily.   pantoprazole 40 MG tablet Commonly known as:  PROTONIX Take 40 mg by mouth 2 (two) times daily.   pramipexole 0.5 MG tablet Commonly known as:  MIRAPEX Take 0.5 mg by mouth at bedtime as needed (restless leg syndrome).       Allergies:      Allergies  Allergen Reactions  . Chlorthalidone Other (See Comments)  . Strawberry Extract Other (See Comments)    Family History:      Family History  Problem Relation Age of Onset  . Emphysema Father     Social History:  reports that he has been smoking pipe. He has never used smokeless tobacco. He reports that he drank alcohol. He reports that he has current or past drug history.  ROS: UROLOGY Frequent Urination?: Yes Hard to postpone urination?: No Burning/pain with urination?: No Get up at night to urinate?: Yes Leakage of urine?: No Urine stream starts and stops?: No Trouble starting stream?: No Do you have to strain to urinate?: No Blood in urine?: No Urinary tract infection?: No Sexually transmitted disease?: No Injury to kidneys or bladder?: No Painful intercourse?: No Weak stream?: No Erection problems?: Yes Penile pain?:  No  Gastrointestinal Nausea?: No Vomiting?: No Indigestion/heartburn?: No Diarrhea?: No Constipation?: No  Constitutional Fever: No Night sweats?: No Weight loss?: No Fatigue?: No  Skin Skin rash/lesions?: No Itching?: No  Eyes Blurred vision?: No Double vision?: No  Ears/Nose/Throat Sore throat?: No Sinus problems?: No  Hematologic/Lymphatic Swollen glands?: No Easy bruising?: No  Cardiovascular Leg swelling?: No Chest pain?: No  Respiratory Cough?: No Shortness of breath?: No  Endocrine Excessive thirst?: No  Musculoskeletal Back pain?: No Joint pain?: No  Neurological Headaches?: No Dizziness?: No  Psychologic Depression?: No Anxiety?:  No  Physical Exam: BP (!) 145/79 (BP Location: Left Arm, Patient Position: Sitting, Cuff Size: Large)   Pulse (!) 105   Ht 6\' 1"  (1.854 m)   Wt 206 lb 12.8 oz (93.8 kg)   BMI 27.28 kg/m   Constitutional:  Alert and oriented, No acute distress. HEENT: [place] AT, moist mucus membranes.  Trachea midline, no masses. Cardiovascular: No clubbing, cyanosis, or edema.  RRR Respiratory: Normal respiratory effort, no increased work of breathing.  Lungs clear GI: Abdomen is soft, nontender, nondistended, no abdominal masses GU: No CVA tenderness.  Phallus with a fairly long prepuce with inability to retract.  The meatus is visualized and is normal.  Testes descended bilateral without masses or tenderness Lymph: No cervical or inguinal lymphadenopathy. Skin: No rashes, bruises or suspicious lesions. Neurologic: Grossly intact, no focal deficits, moving all 4 extremities. Psychiatric: Normal mood and affect.   Assessment & Plan:   77 year old male with phimosis.  We discussed options of circumcision versus dorsal slit.  The pros and cons of each procedure were discussed.  He would like to schedule circumcision.  The procedure was discussed in detail including potential risks of bleeding, infection and scarring.  He indicated all questions were answered and desires to proceed.   Abbie Sons, [place] [address], [place], [place] [address] [phone]

## 2018-03-27 NOTE — Op Note (Signed)
Date of procedure: 03/27/18  Preoperative diagnosis:  1. Phimosis      Postoperative diagnosis:  1. Phimosis     Procedure: 1. Circumcision  Surgeon: John Giovanni, MD  Anesthesia: General  Complications: None  Intraoperative findings: Dense phimosis requiring partial dorsal slit to retract  EBL: Minimal  Specimens: Prepuce  Indication: Ruben Foster is a 77 y.o. patient with inability to retract his foreskin.  Physical exam remarkable for a tight phimosis with inability to retract.  After reviewing the management options for treatment, he elected to proceed with the above surgical procedure(s). We have discussed the potential benefits and risks of the procedure, side effects of the proposed treatment, the likelihood of the patient achieving the goals of the procedure, and any potential problems that might occur during the procedure or recuperation. Informed consent has been obtained.  Description of procedure:  The patient was taken to the operating room and general anesthesia was induced.  The patient was placed in the supine position, prepped and draped in the usual sterile fashion, and preoperative antibiotics were administered. A preoperative time-out was performed.   The outline of the corona radiata was marked circumferentially along the outer prepuce.  A circumferential incision was made following this line.  The prepuce was unable to be retracted after dilation of the distal prepuce by spreading a hemostat.  A partial dorsal slit was performed in order to retract the prepuce. And a second circumferential incision was made approximately 5 mm proximal to the corona radiata.  The intervening sleeve of preputial tissue was sharply excised.  Hemostasis was obtained with cautery.  The frenulum was reapproximated with a 4-0 interrupted chromic suture.  The proximal and distal skin edges were then reapproximated with interrupted 3-0 chromic suture in quadrants.  A dorsal penile  and ring block was then performed with 8 mL of 0.25% plain Sensorcaine  A dressing of Vaseline gauze, Kling and stretch net was then placed  After anesthetic reversal the patient was transported to PACU in stable condition.    John Giovanni, M.D.

## 2018-03-30 LAB — SURGICAL PATHOLOGY

## 2018-04-30 ENCOUNTER — Ambulatory Visit (INDEPENDENT_AMBULATORY_CARE_PROVIDER_SITE_OTHER): Payer: PPO | Admitting: Urology

## 2018-04-30 ENCOUNTER — Ambulatory Visit: Payer: PPO | Admitting: Urology

## 2018-04-30 ENCOUNTER — Encounter: Payer: Self-pay | Admitting: Urology

## 2018-04-30 VITALS — BP 138/84 | HR 90 | Ht 73.0 in | Wt 206.0 lb

## 2018-04-30 DIAGNOSIS — Z09 Encounter for follow-up examination after completed treatment for conditions other than malignant neoplasm: Secondary | ICD-10-CM

## 2018-04-30 NOTE — Progress Notes (Signed)
04/30/2018 8:23 AM   Ruben Foster 07-24-1940 [id number]  Referring provider: No referring provider defined for this encounter.  Chief Complaint  Patient presents with  . Routine Post Op    HPI: Ruben Foster is a 77 y.o. male with severe phimosis a was at s/p circumcision on 03/07/2018 and presents today for a post op follow up.  He reports no associated symptoms following his surgery. He denies symptoms of infection, and participation in sexual activity.   PMH: Past Medical History:  Diagnosis Date  . Diabetes mellitus without complication ([place])   . GERD (gastroesophageal reflux disease)   . Gout   . Hypertension   . Restless legs    Surgical History: Past Surgical History:  Procedure Laterality Date  . APPENDECTOMY    . CHOLECYSTECTOMY    . CIRCUMCISION N/A 03/27/2018   Procedure: CIRCUMCISION ADULT;  Surgeon: Abbie Sons, MD;  Location: [facility] ORS;  Service: Urology;  Laterality: N/A;  MAC & Local anesthesia  . rectal fissure     Home Medications:  Allergies as of 04/30/2018      Reactions   Chlorthalidone Other (See Comments)   Unknown   Strawberry Extract Other (See Comments)   Unknown      Medication List        Accurate as of 04/30/18  8:23 AM. Always use your most recent med list.          allopurinol 300 MG tablet Commonly known as:  ZYLOPRIM Take 300 mg by mouth daily.   cyanocobalamin 2000 MCG tablet Take 2,000 mcg by mouth daily.   gabapentin 300 MG capsule Commonly known as:  NEURONTIN Take 300 mg by mouth at bedtime.   glimepiride 4 MG tablet Commonly known as:  AMARYL Take 4 mg by mouth daily with breakfast.   HYDROcodone-acetaminophen 5-325 MG tablet Commonly known as:  NORCO/VICODIN Take 1 tablet by mouth every 4 (four) hours as needed for moderate pain.   KLOR-CON M20 20 MEQ tablet Generic drug:  potassium chloride SA Take 20 mEq by mouth daily.   lisinopril-hydrochlorothiazide 20-25 MG tablet Commonly known as:   PRINZIDE,ZESTORETIC Take 1 tablet by mouth daily.   metFORMIN 1000 MG tablet Commonly known as:  GLUCOPHAGE Take 1,000 mg by mouth 2 (two) times daily.   pantoprazole 40 MG tablet Commonly known as:  PROTONIX Take 40 mg by mouth daily.   pramipexole 0.5 MG tablet Commonly known as:  MIRAPEX Take 0.5 mg by mouth at bedtime as needed (for restless leg).   SYSTANE BALANCE 0.6 % Soln Generic drug:  Propylene Glycol Place 1 drop into the left eye daily as needed (for dry eyes).   URINOZINC PO Take 1 tablet by mouth daily.      Allergies:  Allergies  Allergen Reactions  . Chlorthalidone Other (See Comments)    Unknown  . Strawberry Extract Other (See Comments)    Unknown   Family History: Family History  Problem Relation Age of Onset  . Emphysema Father    Social History:  reports that he has been smoking pipe. He has never used smokeless tobacco. He reports that he drank alcohol. He reports that he has current or past drug history.  ROS: UROLOGY Frequent Urination?: No Hard to postpone urination?: No Burning/pain with urination?: No Get up at night to urinate?: No Leakage of urine?: No Urine stream starts and stops?: No Trouble starting stream?: No Do you have to strain to urinate?: No Blood in urine?:  No Urinary tract infection?: No Sexually transmitted disease?: No Injury to kidneys or bladder?: No Painful intercourse?: No Weak stream?: No Erection problems?: No Penile pain?: No  Gastrointestinal Nausea?: No Vomiting?: No Indigestion/heartburn?: No Diarrhea?: No Constipation?: No  Constitutional Fever: No Night sweats?: No Weight loss?: No Fatigue?: No  Skin Skin rash/lesions?: No Itching?: No  Eyes Blurred vision?: No Double vision?: No  Ears/Nose/Throat Sore throat?: No Sinus problems?: No  Hematologic/Lymphatic Swollen glands?: No Easy bruising?: No  Cardiovascular Leg swelling?: No Chest pain?: No  Respiratory Cough?:  No Shortness of breath?: No  Endocrine Excessive thirst?: No  Musculoskeletal Back pain?: No Joint pain?: No  Neurological Headaches?: No Dizziness?: No  Psychologic Depression?: No Anxiety?: No  Physical Exam: BP 138/84 (BP Location: Left Arm, Patient Position: Sitting, Cuff Size: Large)   Pulse 90   Ht 6' 1"  (1.854 m)   Wt 206 lb (93.4 kg)   BMI 27.18 kg/m   Constitutional:  Alert and oriented, No acute distress. HEENT: [place] AT, moist mucus membranes.  Trachea midline, no masses. Cardiovascular: No clubbing, cyanosis, or edema. Respiratory: Normal respiratory effort, no increased work of breathing. GI: Abdomen is soft, nontender, nondistended, no abdominal masses GU: No CVA tenderness. Incision line has healed. Minimal swelling. Lymph: No cervical or inguinal lymphadenopathy. Skin: No rashes, bruises or suspicious lesions. Neurologic: Grossly intact, no focal deficits, moving all 4 extremities. Psychiatric: Normal mood and affect.  Laboratory Data: Lab Results  Component Value Date   WBC 7.1 03/22/2018   HGB 13.3 03/27/2018   HCT 39.0 03/27/2018   MCV 91.3 03/22/2018   PLT 233 03/22/2018   Lab Results  Component Value Date   CREATININE 1.15 03/22/2018     Assessment & Plan:    1. Circumcision Adult - Incision line looks good and minimal swelling. - Pt is cleared for normal activities and sexual intercourse - RTC if symptoms worsen or fail to improve  2. Erectile Dysfunction -He has a history of penile fracture with resultant ED and curvature.  Offered patient a reference to [facility] for follow up, but patient denies at this time   Abbie Sons, MD  [place] [address] [place], [place] [address] [phone]  I, Stephania Fragmin , am acting as a scribe for Abbie Sons, MD  I have reviewed the note for accuracy and completeness and agree. John Giovanni, MD

## 2018-05-27 ENCOUNTER — Encounter: Payer: Self-pay | Admitting: Gynecology

## 2018-05-27 ENCOUNTER — Ambulatory Visit: Admission: EM | Admit: 2018-05-27 | Discharge: 2018-05-27 | Discharge status: Against Medical Advice | Payer: PPO

## 2018-05-27 ENCOUNTER — Other Ambulatory Visit: Payer: Self-pay

## 2018-05-27 NOTE — ED Triage Notes (Signed)
Patient c/o cough x several days. Per patient chest hurt with coughing

## 2018-06-04 DIAGNOSIS — E781 Pure hyperglyceridemia: Secondary | ICD-10-CM | POA: Diagnosis not present

## 2018-06-04 DIAGNOSIS — K219 Gastro-esophageal reflux disease without esophagitis: Secondary | ICD-10-CM | POA: Diagnosis not present

## 2018-06-04 DIAGNOSIS — E538 Deficiency of other specified B group vitamins: Secondary | ICD-10-CM | POA: Diagnosis not present

## 2018-06-04 DIAGNOSIS — E1159 Type 2 diabetes mellitus with other circulatory complications: Secondary | ICD-10-CM | POA: Diagnosis not present

## 2018-06-04 DIAGNOSIS — E1142 Type 2 diabetes mellitus with diabetic polyneuropathy: Secondary | ICD-10-CM | POA: Diagnosis not present

## 2018-06-04 DIAGNOSIS — I10 Essential (primary) hypertension: Secondary | ICD-10-CM | POA: Diagnosis not present

## 2018-06-04 DIAGNOSIS — G2581 Restless legs syndrome: Secondary | ICD-10-CM | POA: Diagnosis not present

## 2018-06-04 DIAGNOSIS — M1A0790 Idiopathic chronic gout, unspecified ankle and foot, without tophus (tophi): Secondary | ICD-10-CM | POA: Diagnosis not present

## 2018-06-04 DIAGNOSIS — H6192 Disorder of left external ear, unspecified: Secondary | ICD-10-CM | POA: Diagnosis not present

## 2018-06-04 DIAGNOSIS — Z79899 Other long term (current) drug therapy: Secondary | ICD-10-CM | POA: Diagnosis not present

## 2018-07-12 DIAGNOSIS — D485 Neoplasm of uncertain behavior of skin: Secondary | ICD-10-CM | POA: Diagnosis not present

## 2018-07-12 DIAGNOSIS — C44219 Basal cell carcinoma of skin of left ear and external auricular canal: Secondary | ICD-10-CM | POA: Diagnosis not present

## 2018-08-01 DIAGNOSIS — C44219 Basal cell carcinoma of skin of left ear and external auricular canal: Secondary | ICD-10-CM | POA: Diagnosis not present

## 2018-08-01 DIAGNOSIS — C4491 Basal cell carcinoma of skin, unspecified: Secondary | ICD-10-CM | POA: Diagnosis not present

## 2018-12-05 DIAGNOSIS — Z1329 Encounter for screening for other suspected endocrine disorder: Secondary | ICD-10-CM | POA: Diagnosis not present

## 2018-12-05 DIAGNOSIS — G2581 Restless legs syndrome: Secondary | ICD-10-CM | POA: Diagnosis not present

## 2018-12-05 DIAGNOSIS — E1159 Type 2 diabetes mellitus with other circulatory complications: Secondary | ICD-10-CM | POA: Diagnosis not present

## 2018-12-05 DIAGNOSIS — K219 Gastro-esophageal reflux disease without esophagitis: Secondary | ICD-10-CM | POA: Diagnosis not present

## 2018-12-05 DIAGNOSIS — Z1321 Encounter for screening for nutritional disorder: Secondary | ICD-10-CM | POA: Diagnosis not present

## 2018-12-05 DIAGNOSIS — N529 Male erectile dysfunction, unspecified: Secondary | ICD-10-CM | POA: Diagnosis not present

## 2018-12-05 DIAGNOSIS — M1A0790 Idiopathic chronic gout, unspecified ankle and foot, without tophus (tophi): Secondary | ICD-10-CM | POA: Diagnosis not present

## 2018-12-05 DIAGNOSIS — I10 Essential (primary) hypertension: Secondary | ICD-10-CM | POA: Diagnosis not present

## 2018-12-05 DIAGNOSIS — E538 Deficiency of other specified B group vitamins: Secondary | ICD-10-CM | POA: Diagnosis not present

## 2018-12-05 DIAGNOSIS — E1142 Type 2 diabetes mellitus with diabetic polyneuropathy: Secondary | ICD-10-CM | POA: Diagnosis not present

## 2018-12-05 DIAGNOSIS — E781 Pure hyperglyceridemia: Secondary | ICD-10-CM | POA: Diagnosis not present

## 2018-12-05 DIAGNOSIS — Z125 Encounter for screening for malignant neoplasm of prostate: Secondary | ICD-10-CM | POA: Diagnosis not present

## 2018-12-05 DIAGNOSIS — Z79899 Other long term (current) drug therapy: Secondary | ICD-10-CM | POA: Diagnosis not present

## 2018-12-05 DIAGNOSIS — R0602 Shortness of breath: Secondary | ICD-10-CM | POA: Diagnosis not present

## 2018-12-05 DIAGNOSIS — Z0000 Encounter for general adult medical examination without abnormal findings: Secondary | ICD-10-CM | POA: Diagnosis not present

## 2019-03-07 ENCOUNTER — Ambulatory Visit (INDEPENDENT_AMBULATORY_CARE_PROVIDER_SITE_OTHER): Payer: PPO

## 2019-03-07 ENCOUNTER — Encounter: Payer: Self-pay | Admitting: Emergency Medicine

## 2019-03-07 ENCOUNTER — Other Ambulatory Visit: Payer: Self-pay

## 2019-03-07 ENCOUNTER — Ambulatory Visit
Admission: EM | Admit: 2019-03-07 | Discharge: 2019-03-07 | Disposition: A | Discharge status: Home / Self Care | Payer: PPO | Attending: Emergency Medicine | Admitting: Emergency Medicine

## 2019-03-07 DIAGNOSIS — F17210 Nicotine dependence, cigarettes, uncomplicated: Secondary | ICD-10-CM | POA: Diagnosis not present

## 2019-03-07 DIAGNOSIS — R509 Fever, unspecified: Secondary | ICD-10-CM | POA: Diagnosis not present

## 2019-03-07 DIAGNOSIS — R05 Cough: Secondary | ICD-10-CM | POA: Diagnosis not present

## 2019-03-07 DIAGNOSIS — R059 Cough, unspecified: Secondary | ICD-10-CM

## 2019-03-07 DIAGNOSIS — J069 Acute upper respiratory infection, unspecified: Secondary | ICD-10-CM | POA: Diagnosis not present

## 2019-03-07 DIAGNOSIS — Z7189 Other specified counseling: Secondary | ICD-10-CM

## 2019-03-07 LAB — RAPID INFLUENZA A&B ANTIGENS: Influenza B (ARMC): NEGATIVE

## 2019-03-07 LAB — RAPID INFLUENZA A&B ANTIGENS (ARMC ONLY): Influenza A (ARMC): NEGATIVE

## 2019-03-07 LAB — RAPID STREP SCREEN (MED CTR MEBANE ONLY): Streptococcus, Group A Screen (Direct): NEGATIVE

## 2019-03-07 MED ORDER — BENZONATATE 100 MG PO CAPS: 100.0000 mg | ORAL_CAPSULE | Freq: Three times a day (TID) | ORAL | 0 refills | Status: DC | PRN

## 2019-03-07 MED ORDER — DOXYCYCLINE HYCLATE 100 MG PO CAPS: 100.0000 mg | ORAL_CAPSULE | Freq: Two times a day (BID) | ORAL | 0 refills | Status: DC

## 2019-03-07 NOTE — ED Triage Notes (Signed)
Patient in office today c/o chest/nasal congestion, fever, sorethroat x1d  [id number] med

## 2019-03-07 NOTE — ED Provider Notes (Signed)
[facility] ____________________________________________  Time seen: Approximately 5:50 PM  I have reviewed the triage vital signs and the nursing notes.   HISTORY  Chief Complaint Chest congestion and Nasal Congestion  HPI Ruben Foster is a 78 y.o. male with past medical history of diabetes, GERD and hypertension presenting for evaluation of 1 day quick onset cough, congestion, sore throat and body aches.  Denies known fevers.  Reports his wife at home with similar symptoms in the same timeframe. Denies other known sick contacts. Has not taken any over-the-counter medications for the same complaints.  Reports initially feeling just nasal congestion, and reports some sinus pressure but now with more postnasal drainage and cough.  States cough is intermittently productive of clear to yellowish sputum.  No hemoptysis.  States that he occasionally feels "winded "when walking around, but denies any other similar sensation or shortness of breath.  Denies current shortness of breath.  Denies chest pain.  Denies extremity edema, changes in taste or smell, known fevers, vomiting, diarrhea. Has continued to eat and drink well.  No recent sickness.  Ezequiel Kayser, MD: PCP   Past Medical History:  Diagnosis Date  . Diabetes mellitus without complication ([place])   . GERD (gastroesophageal reflux disease)   . Gout   . Hypertension   . Restless legs     Patient Active Problem List   Diagnosis Date Noted  . Diabetes mellitus, type 2 ([place]) 02/23/2018  . Diabetic neuropathy ([place]) 02/23/2018  . Hypertriglyceridemia 02/23/2018  . Gastroenteritis 10/21/2017  . Vitamin B 12 deficiency 02/27/2017  . Restless leg syndrome, controlled 02/13/2017  . GERD (gastroesophageal reflux disease) 11/04/2016  . ED (erectile dysfunction) 10/04/2013  . Gout 10/04/2013  . HTN (hypertension) 10/04/2013    Past Surgical History:  Procedure Laterality Date  . APPENDECTOMY    . CHOLECYSTECTOMY    .  CIRCUMCISION N/A 03/27/2018   Procedure: CIRCUMCISION ADULT;  Surgeon: Abbie Sons, MD;  Location: [facility] ORS;  Service: Urology;  Laterality: N/A;  MAC & Local anesthesia  . rectal fissure       No current facility-administered medications for this encounter.   Current Outpatient Medications:  .  allopurinol (ZYLOPRIM) 300 MG tablet, Take 300 mg by mouth daily. , Disp: , Rfl:  .  cyanocobalamin 2000 MCG tablet, Take 2,000 mcg by mouth daily., Disp: , Rfl:  .  gabapentin (NEURONTIN) 300 MG capsule, Take 300 mg by mouth at bedtime., Disp: , Rfl:  .  glimepiride (AMARYL) 4 MG tablet, Take 4 mg by mouth daily with breakfast. , Disp: , Rfl: 1 .  lisinopril-hydrochlorothiazide (PRINZIDE,ZESTORETIC) 20-25 MG tablet, Take 1 tablet by mouth daily., Disp: , Rfl: 1 .  metFORMIN (GLUCOPHAGE) 1000 MG tablet, Take 1,000 mg by mouth 2 (two) times daily., Disp: , Rfl: 1 .  Misc Natural Products (URINOZINC PO), Take 1 tablet by mouth daily., Disp: , Rfl:  .  pantoprazole (PROTONIX) 40 MG tablet, Take 40 mg by mouth daily. , Disp: , Rfl:  .  potassium chloride SA (KLOR-CON M20) 20 MEQ tablet, Take 20 mEq by mouth daily. , Disp: , Rfl:  .  benzonatate (TESSALON PERLES) 100 MG capsule, Take 1 capsule (100 mg total) by mouth 3 (three) times daily as needed for cough., Disp: 15 capsule, Rfl: 0 .  doxycycline (VIBRAMYCIN) 100 MG capsule, Take 1 capsule (100 mg total) by mouth 2 (two) times daily., Disp: 20 capsule, Rfl: 0  Allergies Chlorthalidone and Strawberry extract  Family History  Problem Relation Age of Onset  . Emphysema Father     Social History Social History   Tobacco Use  . Smoking status: Current Some Day Smoker    Types: Pipe  . Smokeless tobacco: Never Used  Substance Use Topics  . Alcohol use: Not Currently    Frequency: Never  . Drug use: Not Currently    Review of Systems Constitutional: Denies known fever.  Positive body ache. ENT: Mild sore throat.  Positive nasal  congestion. Cardiovascular: Denies chest pain. Respiratory: Denies shortness of breath. Gastrointestinal: No abdominal pain.  No nausea, no vomiting.  No diarrhea.  Genitourinary: Negative for dysuria. Musculoskeletal: Negative for back pain. Skin: Negative for rash.   ____________________________________________   PHYSICAL EXAM:  VITAL SIGNS: ED Triage Vitals  Enc Vitals Group     BP 03/07/19 1702 131/82     Pulse Rate 03/07/19 1702 (!) 102     Resp -- 20     Temp 03/07/19 1702 100.2 F (37.9 C)     Temp Source 03/07/19 1702 Oral     SpO2 03/07/19 1702 97 %     Weight 03/07/19 1655 206 lb (93.4 kg)     Height --      Head Circumference --      Peak Flow --      Pain Score 03/07/19 1655 7     Pain Loc --      Pain Edu? --      Excl. in [place]? --   Ambulatory O2 sat 94% by RN  Constitutional: Alert and oriented.  Eyes: Conjunctivae are normal.  Head: Atraumatic. No sinus tenderness to palpation. No swelling. No erythema.  Ears: no erythema, normal TMs bilaterally.   Nose:Nasal congestion   Mouth/Throat: Mucous membranes are moist. No pharyngeal erythema. No tonsillar swelling or exudate.  Neck: No stridor.  No cervical spine tenderness to palpation. Hematological/Lymphatic/Immunilogical: No cervical lymphadenopathy. Cardiovascular: Normal rate, regular rhythm. Grossly normal heart sounds.  Good peripheral circulation. Respiratory: Normal respiratory effort.  No retractions. No wheezes.  Minimally diminished breath sounds bases.  Dry intermittent cough.  Speaks in complete sentences. Musculoskeletal: Ambulatory with steady gait.  No extremity edema noted bilaterally. Neurologic:  Normal speech and language. No gait instability. Skin:  Skin appears warm, dry and intact. No rash noted. Psychiatric: Mood and affect are normal. Speech and behavior are normal. ___________________________________________   LABS (all labs ordered are listed, but only abnormal results are  displayed)  Labs Reviewed  RAPID STREP SCREEN ([facility] ONLY)  RAPID INFLUENZA A&B ANTIGENS ([facility] ONLY)  NOVEL CORONAVIRUS, NAA (HOSP ORDER, SEND-OUT TO REF LAB; TAT 18-24 HRS)  CULTURE, GROUP A STREP [facility])    RADIOLOGY  Dg Chest 2 View  Result Date: 03/07/2019 CLINICAL DATA:  Patient with nasal congestion.  Fever. EXAM: CHEST - 2 VIEW COMPARISON:  Chest radiograph Oct 21, 2017 FINDINGS: Stable cardiac and mediastinal contours. Aortic atherosclerosis. Bibasilar atelectasis. No large area pulmonary consolidation. No pleural effusion or pneumothorax. Thoracic spine degenerative changes. IMPRESSION: No acute cardiopulmonary process. Electronically Signed   By: Lovey Newcomer M.D.   On: 03/07/2019 18:22   ____________________________________________   PROCEDURES Procedures     INITIAL IMPRESSION / ASSESSMENT AND PLAN / ED COURSE  Pertinent labs & imaging results that were available during my care of the patient were reviewed by me and considered in my medical decision making (see chart for details).  Presenting for evaluation of 1 day of cough and congestion complaints.  Strep negative, will culture.  Influenza negative.  COVID-19 testing completed and advice given.  Chest x-ray as above per radiologist, no acute cardiopulmonary process.  Suspect viral illness, however with comorbidities will empirically start on doxycycline.  PRN Tessalon Perles.  Over-the-counter Tylenol.  Declined Tylenol while in urgent care and states he will take once he leaves.  Close monitoring.  Discussed very strict follow-up and return parameters including proceeding directly to the emergency room for any worsening complaints.Discussed indication, risks and benefits of medications with patient.  Discussed follow up with Primary care physician this week. Discussed follow up and return parameters including no resolution or any worsening concerns. Patient verbalized understanding and agreed to plan.    ____________________________________________   FINAL CLINICAL IMPRESSION(S) / ED DIAGNOSES  Final diagnoses:  Cough  Upper respiratory tract infection, unspecified type  Advice given about COVID-19 virus infection     ED Discharge Orders         Ordered    doxycycline (VIBRAMYCIN) 100 MG capsule  2 times daily     03/07/19 1834    benzonatate (TESSALON PERLES) 100 MG capsule  3 times daily PRN     03/07/19 1834           Note: This dictation was prepared with Dragon dictation along with smaller phrase technology. Any transcriptional errors that result from this process are unintentional.         Marylene Land, NP 03/07/19 1842

## 2019-03-07 NOTE — Discharge Instructions (Signed)
Take medication as prescribed. Rest. Drink plenty of fluids.  Monitor self closely.  Over-the-counter Tylenol as needed.  Follow up with your primary care physician this week as needed. Return to Urgent care as needed.  Proceed directly to the emergency room for any chest pain, shortness of breath or worsening complaints.

## 2019-03-09 LAB — NOVEL CORONAVIRUS, NAA (HOSP ORDER, SEND-OUT TO REF LAB; TAT 18-24 HRS): SARS-CoV-2, NAA: NOT DETECTED

## 2019-03-10 LAB — CULTURE, GROUP A STREP (THRC)

## 2019-04-25 ENCOUNTER — Encounter: Payer: Self-pay | Admitting: Emergency Medicine

## 2019-04-25 ENCOUNTER — Other Ambulatory Visit: Payer: Self-pay

## 2019-04-25 ENCOUNTER — Emergency Department
Admission: EM | Admit: 2019-04-25 | Discharge: 2019-04-25 | Disposition: A | Discharge status: Home / Self Care | Payer: PPO | Attending: Emergency Medicine | Admitting: Emergency Medicine

## 2019-04-25 DIAGNOSIS — I10 Essential (primary) hypertension: Secondary | ICD-10-CM | POA: Insufficient documentation

## 2019-04-25 DIAGNOSIS — E1140 Type 2 diabetes mellitus with diabetic neuropathy, unspecified: Secondary | ICD-10-CM | POA: Insufficient documentation

## 2019-04-25 DIAGNOSIS — Z7984 Long term (current) use of oral hypoglycemic drugs: Secondary | ICD-10-CM | POA: Diagnosis not present

## 2019-04-25 DIAGNOSIS — Z79899 Other long term (current) drug therapy: Secondary | ICD-10-CM | POA: Diagnosis not present

## 2019-04-25 DIAGNOSIS — R21 Rash and other nonspecific skin eruption: Secondary | ICD-10-CM | POA: Diagnosis not present

## 2019-04-25 DIAGNOSIS — F17290 Nicotine dependence, other tobacco product, uncomplicated: Secondary | ICD-10-CM | POA: Insufficient documentation

## 2019-04-25 LAB — CBC WITH DIFFERENTIAL/PLATELET
Abs Immature Granulocytes: 0.04 10*3/uL (ref 0.00–0.07)
Basophils Absolute: 0 10*3/uL (ref 0.0–0.1)
Basophils Relative: 0 %
Eosinophils Absolute: 0.1 10*3/uL (ref 0.0–0.5)
Eosinophils Relative: 1 %
HCT: 40.4 % (ref 39.0–52.0)
Hemoglobin: 13.8 g/dL (ref 13.0–17.0)
Immature Granulocytes: 0 %
Lymphocytes Relative: 25 %
Lymphs Abs: 2.4 10*3/uL (ref 0.7–4.0)
MCH: 31 pg (ref 26.0–34.0)
MCHC: 34.2 g/dL (ref 30.0–36.0)
MCV: 90.8 fL (ref 80.0–100.0)
Monocytes Absolute: 0.4 10*3/uL (ref 0.1–1.0)
Monocytes Relative: 4 %
Neutro Abs: 6.5 10*3/uL (ref 1.7–7.7)
Neutrophils Relative %: 70 %
Platelets: 285 10*3/uL (ref 150–400)
RBC: 4.45 MIL/uL (ref 4.22–5.81)
RDW: 13.4 % (ref 11.5–15.5)
WBC: 9.5 10*3/uL (ref 4.0–10.5)
nRBC: 0 % (ref 0.0–0.2)

## 2019-04-25 LAB — COMPREHENSIVE METABOLIC PANEL
ALT: 26 U/L (ref 0–44)
AST: 34 U/L (ref 15–41)
Albumin: 4 g/dL (ref 3.5–5.0)
Alkaline Phosphatase: 39 U/L (ref 38–126)
Anion gap: 17 — ABNORMAL HIGH (ref 5–15)
BUN: 15 mg/dL (ref 8–23)
CO2: 22 mmol/L (ref 22–32)
Calcium: 9.1 mg/dL (ref 8.9–10.3)
Chloride: 99 mmol/L (ref 98–111)
Creatinine, Ser: 1.16 mg/dL (ref 0.61–1.24)
GFR calc Af Amer: >60 mL/min (ref >60)
GFR calc non Af Amer: 60 mL/min — ABNORMAL LOW (ref >60)
Glucose, Bld: 201 mg/dL — ABNORMAL HIGH (ref 70–99)
Potassium: 3.3 mmol/L — ABNORMAL LOW (ref 3.5–5.1)
Sodium: 138 mmol/L (ref 135–145)
Total Bilirubin: 1.3 mg/dL — ABNORMAL HIGH (ref 0.3–1.2)
Total Protein: 7 g/dL (ref 6.5–8.1)

## 2019-04-25 MED ORDER — HYDROXYZINE HCL 25 MG PO TABS
25.0000 mg | ORAL_TABLET | Freq: Once | ORAL | Status: AC
  Administered 2019-04-25: 12:00:00 25 mg via ORAL
  Filled 2019-04-25: qty 1

## 2019-04-25 MED ORDER — HYDROXYZINE HCL 10 MG PO TABS
10.0000 mg | ORAL_TABLET | Freq: Once | ORAL | Status: DC
  Filled 2019-04-25: qty 1

## 2019-04-25 MED ORDER — PREDNISONE 10 MG PO TABS: ORAL_TABLET | ORAL | 0 refills | Status: DC

## 2019-04-25 MED ORDER — HYDROXYZINE HCL 10 MG PO TABS: 10.0000 mg | ORAL_TABLET | Freq: Three times a day (TID) | ORAL | 0 refills | Status: AC | PRN

## 2019-04-25 NOTE — Discharge Instructions (Signed)
Follow-up with your primary care provider if not improving for reevaluation.  Return to the emergency department if any severe worsening of your symptoms.  The hydroxyzine is a medication for itching.  It may cause you to become drowsy.  Do not drive while taking this medication.  Also be careful not to fall.  The prednisone is also to help with the itching and the rash.  If there is any worsening of your rash or urgent concerns return to the emergency department over the holiday weekend.

## 2019-04-25 NOTE — ED Triage Notes (Signed)
Pt in via POV, reports rash to entire body since last night.  Denies any new soaps, detergents, medications, or foods.  Ambulatory to triage, able to speak in full, clear sentences.  NAD noted at this time.

## 2019-04-25 NOTE — ED Provider Notes (Addendum)
[facility] Emergency Department Provider Note  ____________________________________________   First MD Initiated Contact with Patient 04/25/19 [phone]     (approximate)  I have reviewed the triage vital signs and the nursing notes.   HISTORY  Chief Complaint Rash   HPI Ruben Foster is a 78 y.o. male presents to the ED with complaint of rash that started last evening.  Patient states the only thing he has done different recently is yesterday he ate to him biscuits and drink coffee while hunting.  Patient states he is been itching all night and has been scratching at various areas.  He denies any difficulty breathing, fever or chills.  Patient has not taken any medication for his rash.  Rash is constant and does not change in appearance.  Patient denies any tick bites.       Past Medical History:  Diagnosis Date  . Diabetes mellitus without complication ([place])   . GERD (gastroesophageal reflux disease)   . Gout   . Hypertension   . Restless legs     Patient Active Problem List   Diagnosis Date Noted  . Diabetes mellitus, type 2 ([place]) 02/23/2018  . Diabetic neuropathy ([place]) 02/23/2018  . Hypertriglyceridemia 02/23/2018  . Gastroenteritis 10/21/2017  . Vitamin B 12 deficiency 02/27/2017  . Restless leg syndrome, controlled 02/13/2017  . GERD (gastroesophageal reflux disease) 11/04/2016  . ED (erectile dysfunction) 10/04/2013  . Gout 10/04/2013  . HTN (hypertension) 10/04/2013    Past Surgical History:  Procedure Laterality Date  . APPENDECTOMY    . CHOLECYSTECTOMY    . CIRCUMCISION N/A 03/27/2018   Procedure: CIRCUMCISION ADULT;  Surgeon: Abbie Sons, MD;  Location: [facility] ORS;  Service: Urology;  Laterality: N/A;  MAC & Local anesthesia  . rectal fissure      Prior to Admission medications   Medication Sig Start Date End Date Taking? Authorizing Provider  allopurinol (ZYLOPRIM) 300 MG tablet Take 300 mg by mouth daily.  08/04/17    [provider]  cyanocobalamin 2000 MCG tablet Take 2,000 mcg by mouth daily.    [provider]  gabapentin (NEURONTIN) 300 MG capsule Take 300 mg by mouth at bedtime.    [provider]  glimepiride (AMARYL) 4 MG tablet Take 4 mg by mouth daily with breakfast.  09/15/17   [provider]  hydrOXYzine (ATARAX/VISTARIL) 10 MG tablet Take 1 tablet (10 mg total) by mouth 3 (three) times daily as needed. 04/25/19   Johnn Hai, PA-C  lisinopril-hydrochlorothiazide (PRINZIDE,ZESTORETIC) 20-25 MG tablet Take 1 tablet by mouth daily. 08/30/17   [provider]  metFORMIN (GLUCOPHAGE) 1000 MG tablet Take 1,000 mg by mouth 2 (two) times daily. 09/22/17   [provider]  Misc Natural Products (URINOZINC PO) Take 1 tablet by mouth daily.    [provider]  pantoprazole (PROTONIX) 40 MG tablet Take 40 mg by mouth daily.     [provider]  potassium chloride SA (KLOR-CON M20) 20 MEQ tablet Take 20 mEq by mouth daily.  05/11/17   [provider]  predniSONE (DELTASONE) 10 MG tablet Take 3 tablets once a day for 5 days 04/25/19   Johnn Hai, PA-C    Allergies Chlorthalidone and Strawberry extract  Family History  Problem Relation Age of Onset  . Emphysema Father     Social History Social History   Tobacco Use  . Smoking status: Current Some Day Smoker    Types: Pipe  . Smokeless  tobacco: Never Used  Substance Use Topics  . Alcohol use: Not Currently    Frequency: Never  . Drug use: Not Currently    Review of Systems Constitutional: No fever/chills Cardiovascular: Denies chest pain. Respiratory: Denies shortness of breath. Gastrointestinal: No abdominal pain.  No nausea, no vomiting.   Musculoskeletal: Negative for muscle aches. Skin: Positive for rash. Neurological: Negative for headaches, focal weakness or numbness. ____________________________________________   PHYSICAL EXAM:  VITAL  SIGNS: ED Triage Vitals  Enc Vitals Group     BP 04/25/19 0900 (!) 140/98     Pulse Rate 04/25/19 0900 98     Resp 04/25/19 0900 18     Temp 04/25/19 0900 98.4 F (36.9 C)     Temp Source 04/25/19 0900 Oral     SpO2 04/25/19 0900 100 %     Weight 04/25/19 0857 206 lb (93.4 kg)     Height 04/25/19 0857 _0  (1.854 m)     Head Circumference --      Peak Flow --      Pain Score 04/25/19 0857 0     Pain Loc --      Pain Edu? --      Excl. in [place]? --     Constitutional: Alert and oriented. Well appearing and in no acute distress. Eyes: Conjunctivae are normal.  Head: Atraumatic. Neck: No stridor.   Cardiovascular: Normal rate, regular rhythm. Grossly normal heart sounds.  Good peripheral circulation. Respiratory: Normal respiratory effort.  No retractions. Lungs CTAB.  Patient is able to talk in complete sentences without any difficulties. Gastrointestinal: Soft and nontender. No distention.  Musculoskeletal: Moves upper and lower extremities with any difficulty.  No edema is noted in the lower extremities.  Patient is ambulatory without any assistance. Neurologic:  Normal speech and language. No gross focal neurologic deficits are appreciated. No gait instability. Skin:  Skin is warm, dry and intact.  There are multiple areas of erythema generalized from the neck down to his feet.  Patient has both anterior and posterior trunk involvement.  Areas are irregular with some of them looking more erythematous than others.  No edema in these areas.  No target lesions and no evidence of an insect bite for ticks. Psychiatric: Mood and affect are normal. Speech and behavior are normal.  ____________________________________________   LABS (all labs ordered are listed, but only abnormal results are displayed)  Labs Reviewed  COMPREHENSIVE METABOLIC PANEL - Abnormal; Notable for the following components:      Result Value   Potassium 3.3 (*)    Glucose, Bld 201 (*)    Total Bilirubin 1.3 (*)     GFR calc non Af Amer 60 (*)    Anion gap 17 (*)    All other components within normal limits  CBC WITH DIFFERENTIAL/PLATELET    PROCEDURES  Procedure(s) performed (including Critical Care):  Procedures  ____________________________________________   INITIAL IMPRESSION / ASSESSMENT AND PLAN / ED COURSE  As part of my medical decision making, I reviewed the following data within the electronic MEDICAL RECORD NUMBER Notes from prior ED visits and [place] Controlled Substance Database  78 year old male presents to the ED with complaint of rash that started last p.m.  Patient denies any difficulty breathing and did not take any over-the-counter medications for his rash.  He states he was unable to sleep last night because of all the itching.  Currently he is talking in complete sentences without any difficulty.  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  Lab work was reassuring.  Patient was given Atarax prior to discharge.  Patient was made aware that this medication could cause drowsiness and increase his risk for falling.  A prescription for Atarax 10 mg was sent to the pharmacy along with prednisone 30 mg for the next 5 days.  He will follow-up with his PCP if any continued problems.  He was told to return to the emergency department if any worsening of his symptoms.  Also as an incidental note while he was being wheeled to the lobby he told the nurse that he was allergic to strawberries.  When she asked when the last time he ate strawberries was he continued to avoid answering the nurse to prevent his wife from knowing.  Patient reported he was itching less prior to discharge.   ____________________________________________   FINAL CLINICAL IMPRESSION(S) / ED DIAGNOSES  Final diagnoses:  Rash and nonspecific skin eruption     ED Discharge Orders         Ordered    hydrOXYzine (ATARAX/VISTARIL) 10 MG tablet  3 times daily PRN     04/25/19 1145    predniSONE (DELTASONE) 10 MG tablet      04/25/19 1145           Note:  This document was prepared using Dragon voice recognition software and may include unintentional dictation errors.    Johnn Hai, PA-C 04/25/19 1349    Johnn Hai, PA-C 04/25/19 1405    Harvest Dark, MD 04/25/19 1427

## 2019-05-30 IMAGING — DX DG ABDOMEN 2V
3 series · 3 of 3 positions shown · non-contrast
Comparison: None.

CLINICAL DATA: Weakness and vomiting.

EXAM:
ABDOMEN - 2 VIEW

[abdomen erect]
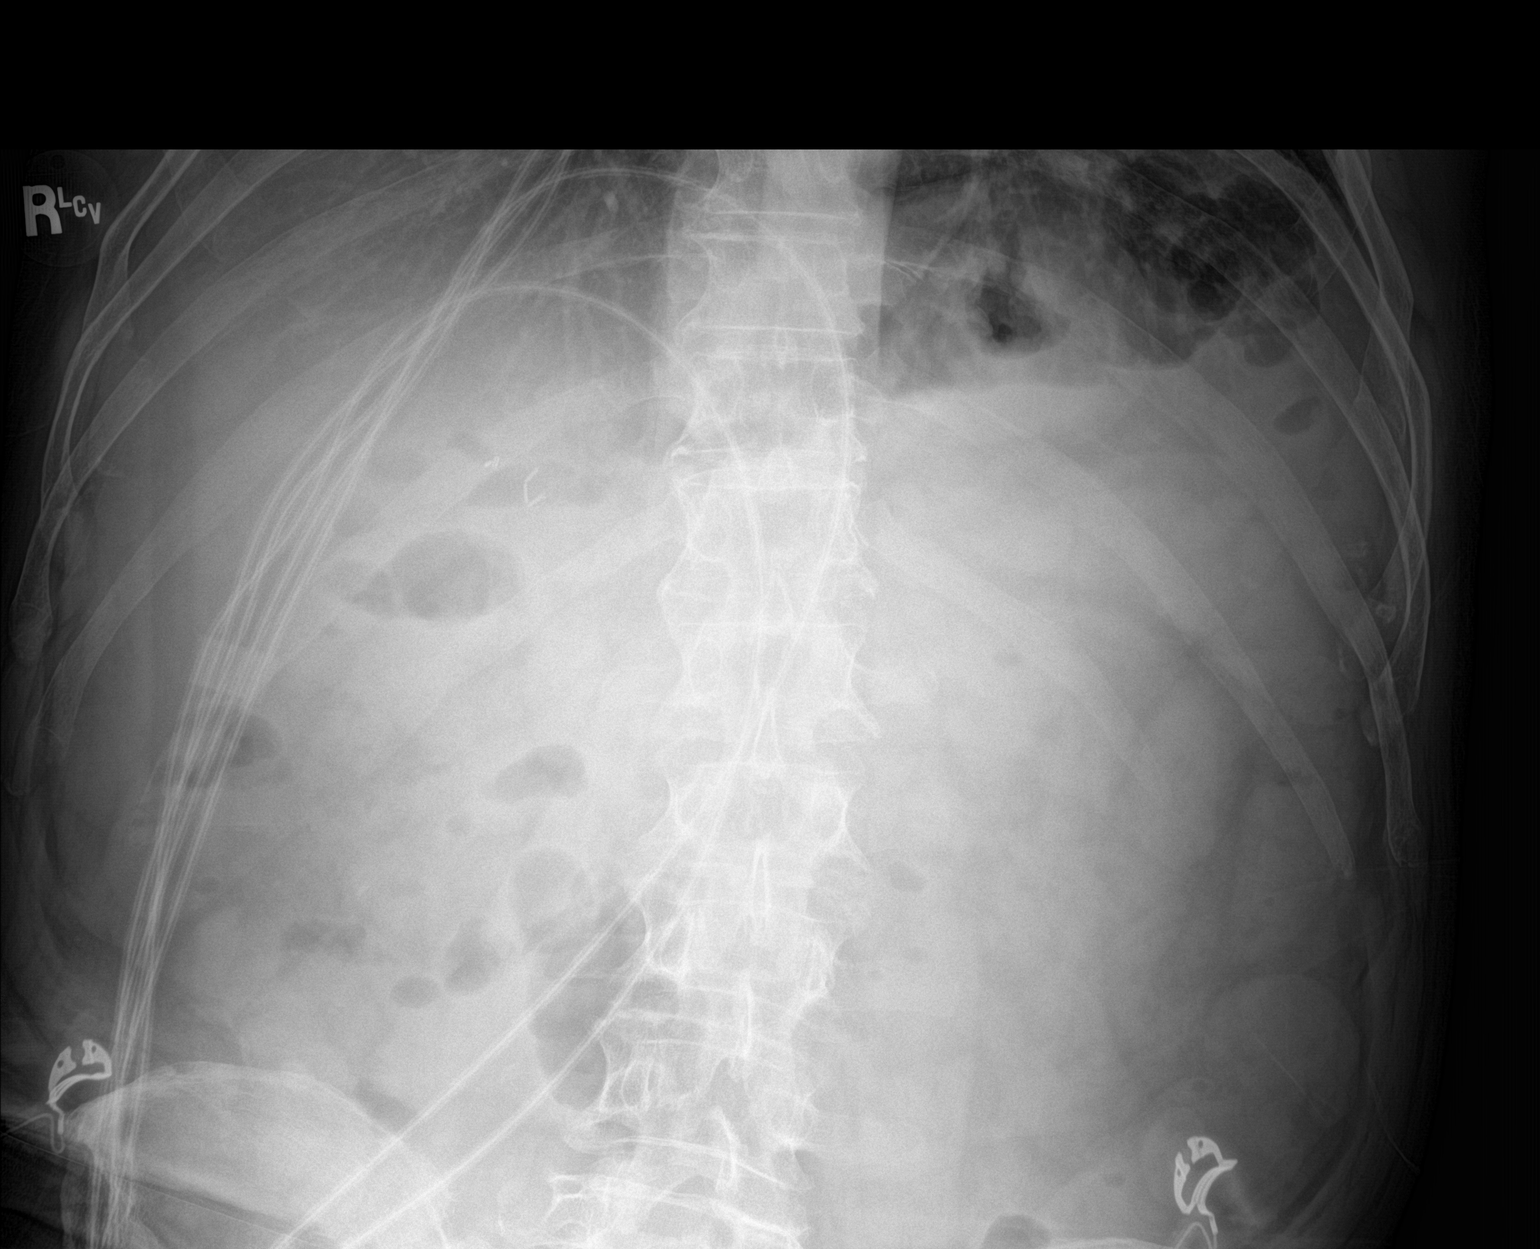

[abdomen supine (1 of 2)]
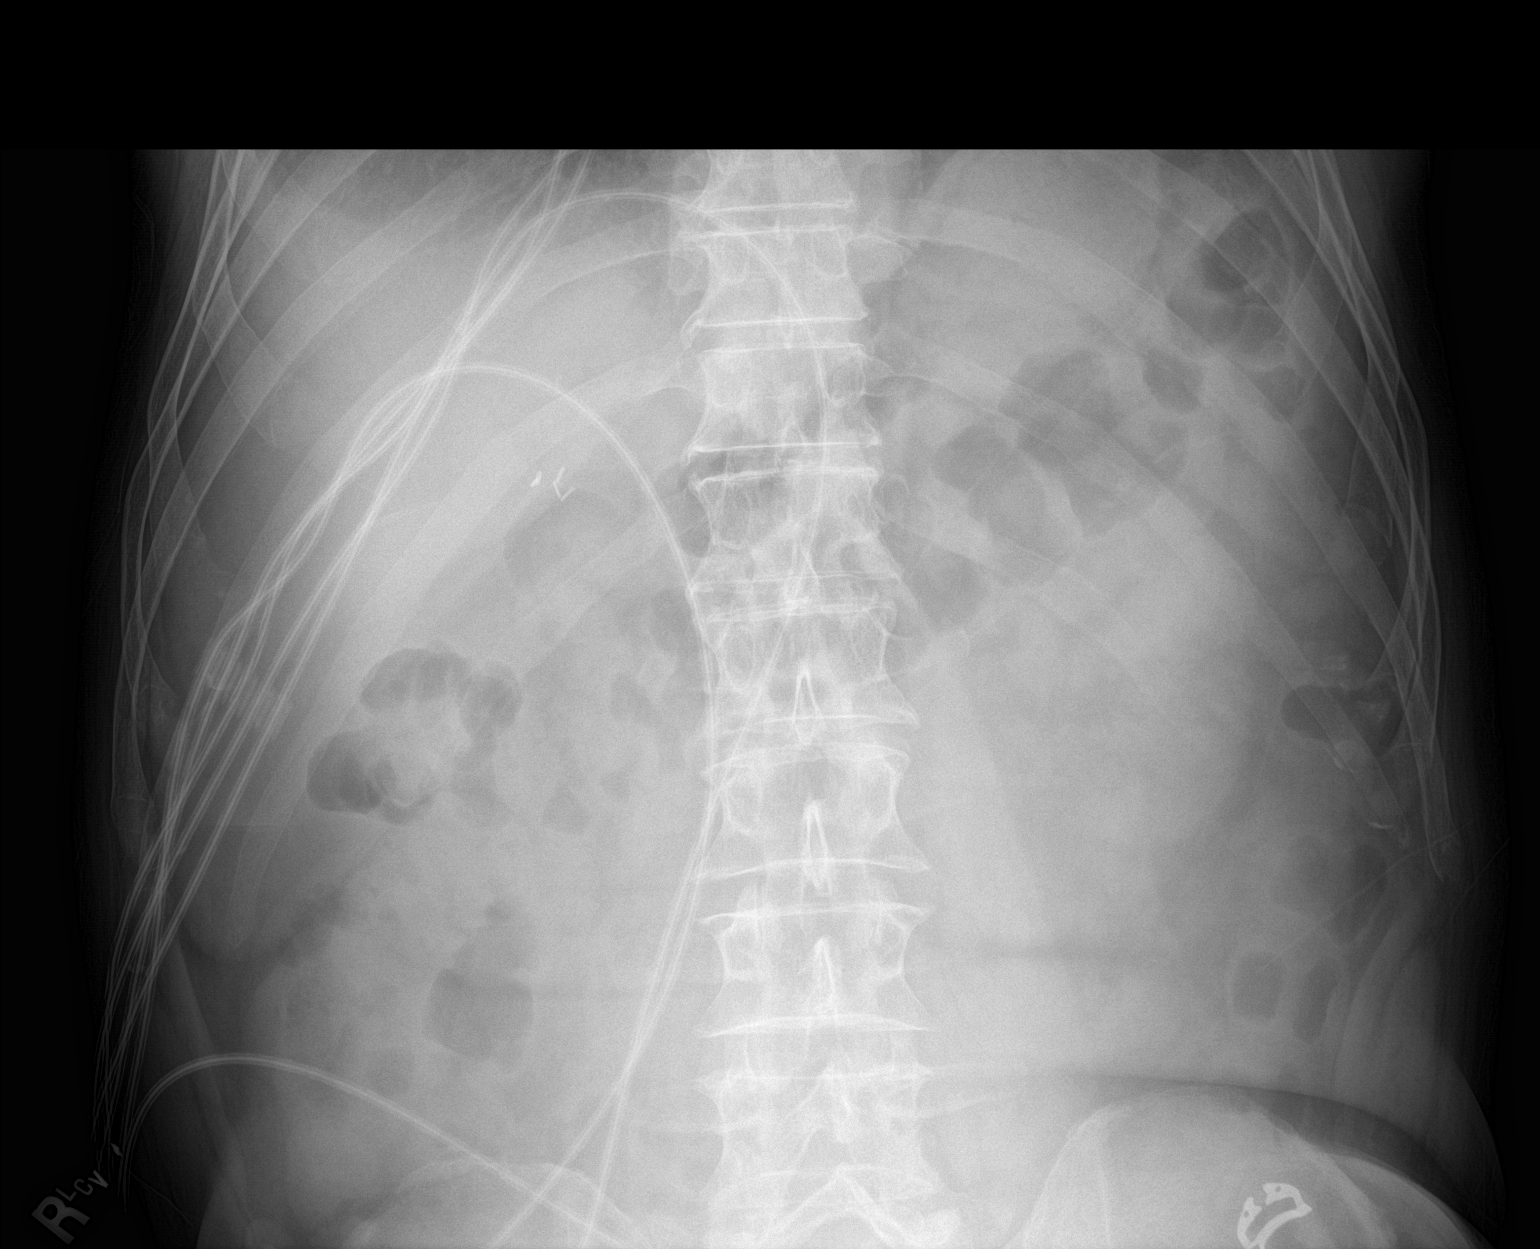

[abdomen supine (2 of 2)]
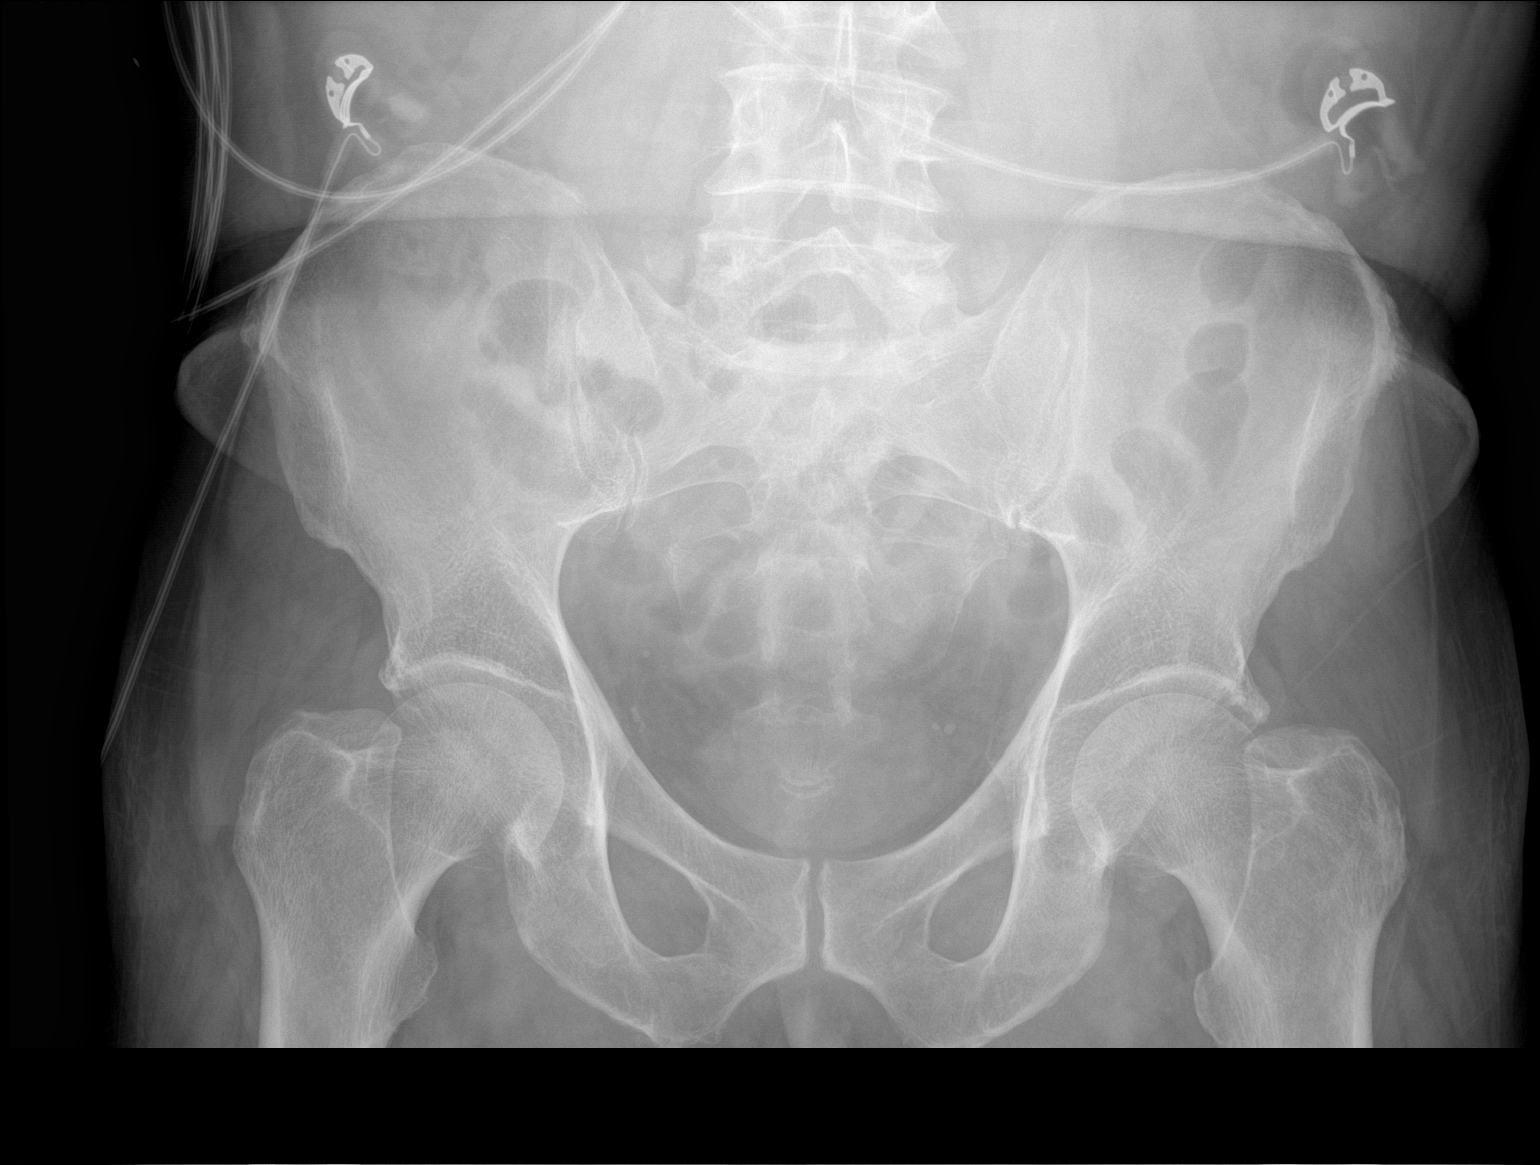

[3 of 3 positions shown; findings below may reference images not displayed]

FINDINGS: The bowel gas pattern is normal. There is no evidence of free air.
No radio-opaque calculi or other significant radiographic
abnormality is seen.
IMPRESSION: Negative.

## 2019-05-30 IMAGING — CR DG CHEST 2V
2 series · 2 of 2 positions shown · non-contrast
Comparison: 06/13/2013

CLINICAL DATA: Dizziness and emesis today.

EXAM:
CHEST - 2 VIEW

[chest lat]
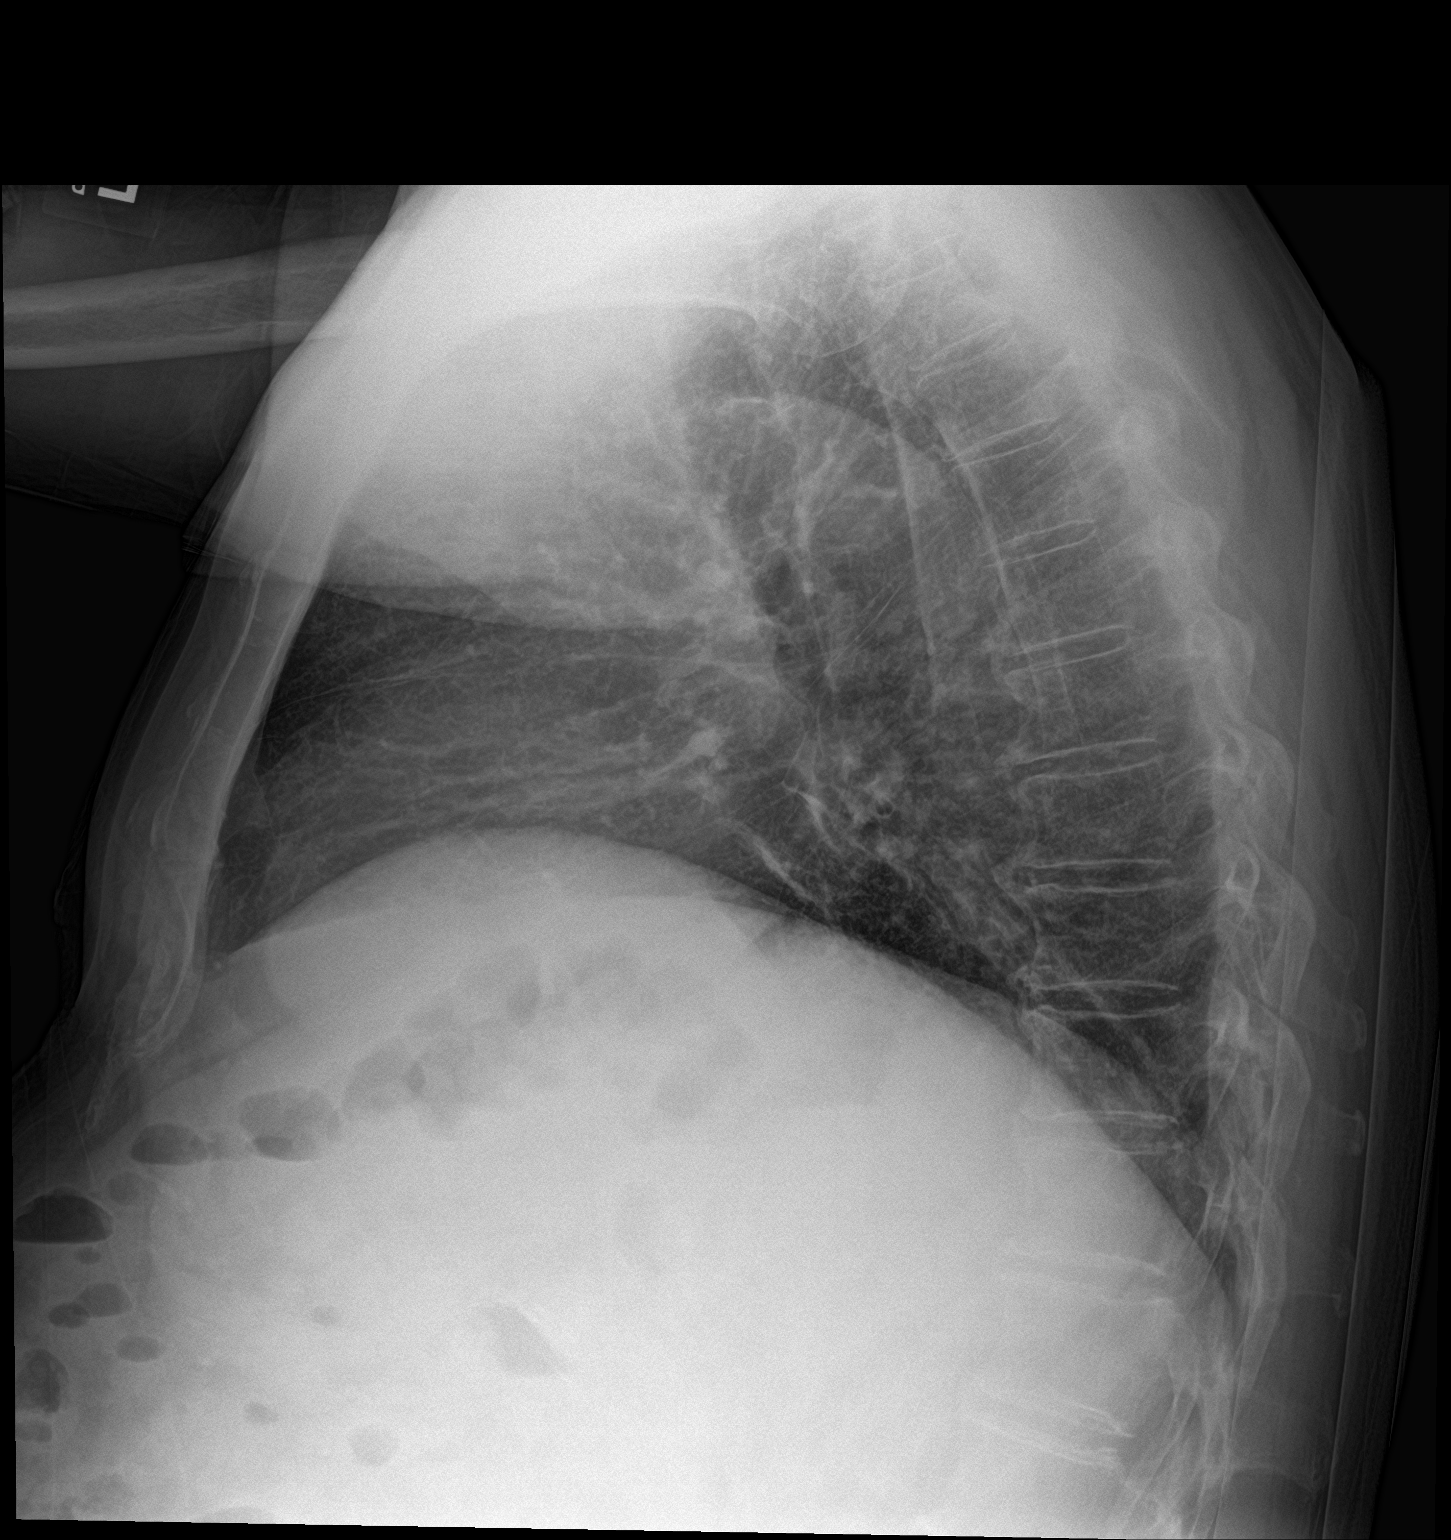

[chest ap]
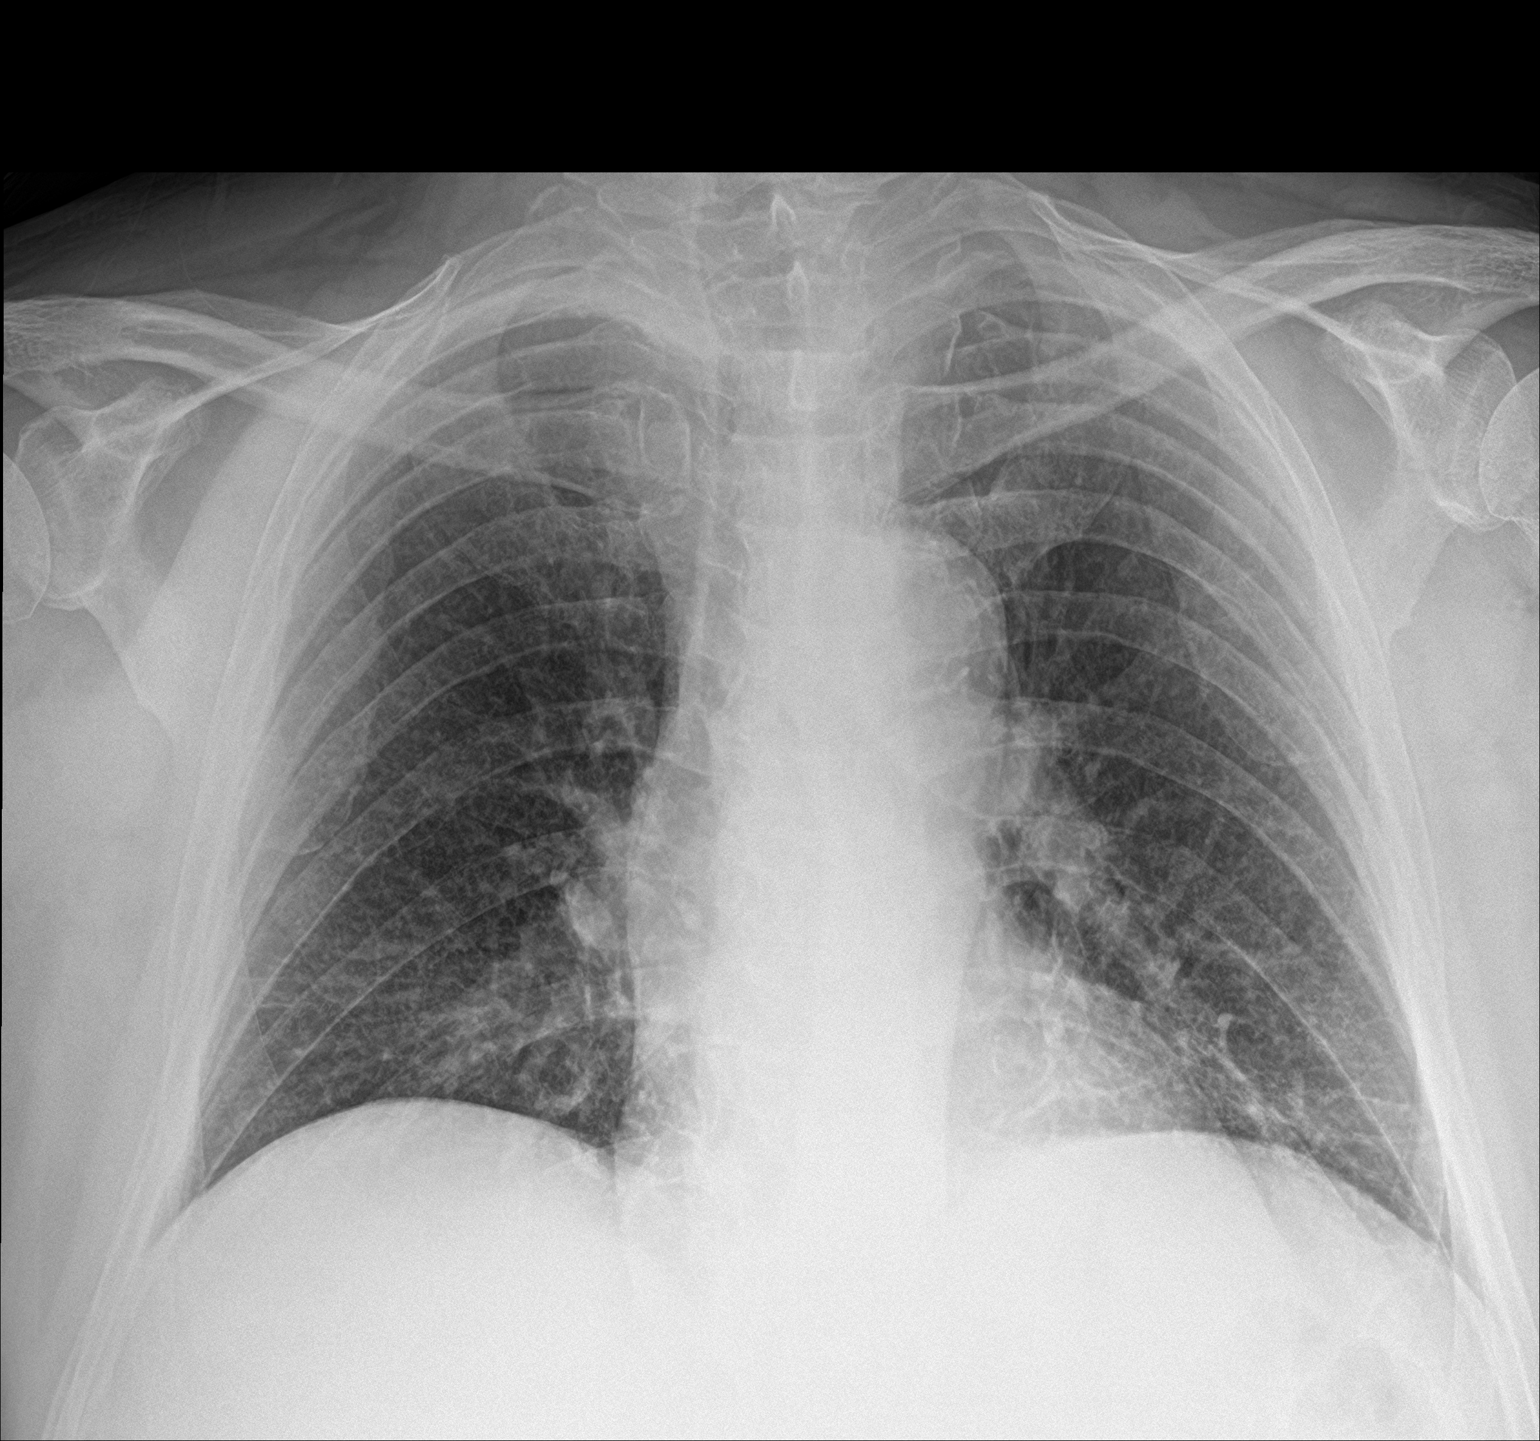

[2 of 2 positions shown; findings below may reference images not displayed]

FINDINGS: Cardiomediastinal silhouette is normal. Mediastinal contours appear
intact. Calcific atherosclerotic disease of the aorta.

There is no evidence of focal airspace consolidation, pleural
effusion or pneumothorax.

Osseous structures are without acute abnormality. Soft tissues are
grossly normal.
IMPRESSION: No active cardiopulmonary disease.

Calcific atherosclerotic disease of the aorta.

## 2019-06-05 DIAGNOSIS — Z0000 Encounter for general adult medical examination without abnormal findings: Secondary | ICD-10-CM | POA: Diagnosis not present

## 2019-06-05 DIAGNOSIS — E1142 Type 2 diabetes mellitus with diabetic polyneuropathy: Secondary | ICD-10-CM | POA: Diagnosis not present

## 2019-06-05 DIAGNOSIS — Z7189 Other specified counseling: Secondary | ICD-10-CM | POA: Diagnosis not present

## 2019-06-05 DIAGNOSIS — E559 Vitamin D deficiency, unspecified: Secondary | ICD-10-CM | POA: Diagnosis not present

## 2019-06-05 DIAGNOSIS — Z79899 Other long term (current) drug therapy: Secondary | ICD-10-CM | POA: Diagnosis not present

## 2019-06-05 DIAGNOSIS — E781 Pure hyperglyceridemia: Secondary | ICD-10-CM | POA: Diagnosis not present

## 2019-06-05 DIAGNOSIS — E1159 Type 2 diabetes mellitus with other circulatory complications: Secondary | ICD-10-CM | POA: Diagnosis not present

## 2019-06-05 DIAGNOSIS — G2581 Restless legs syndrome: Secondary | ICD-10-CM | POA: Diagnosis not present

## 2019-06-05 DIAGNOSIS — I10 Essential (primary) hypertension: Secondary | ICD-10-CM | POA: Diagnosis not present

## 2019-06-18 ENCOUNTER — Encounter: Payer: Self-pay | Admitting: Emergency Medicine

## 2019-06-18 ENCOUNTER — Emergency Department: Payer: PPO

## 2019-06-18 ENCOUNTER — Emergency Department
Admission: EM | Admit: 2019-06-18 | Discharge: 2019-06-19 | Disposition: A | Discharge status: Home / Self Care | Payer: PPO | Attending: Emergency Medicine | Admitting: Emergency Medicine

## 2019-06-18 ENCOUNTER — Other Ambulatory Visit: Payer: Self-pay

## 2019-06-18 DIAGNOSIS — Z7984 Long term (current) use of oral hypoglycemic drugs: Secondary | ICD-10-CM | POA: Diagnosis not present

## 2019-06-18 DIAGNOSIS — I10 Essential (primary) hypertension: Secondary | ICD-10-CM | POA: Insufficient documentation

## 2019-06-18 DIAGNOSIS — U071 COVID-19: Secondary | ICD-10-CM | POA: Diagnosis not present

## 2019-06-18 DIAGNOSIS — E119 Type 2 diabetes mellitus without complications: Secondary | ICD-10-CM | POA: Diagnosis not present

## 2019-06-18 DIAGNOSIS — J189 Pneumonia, unspecified organism: Secondary | ICD-10-CM | POA: Diagnosis not present

## 2019-06-18 DIAGNOSIS — F17210 Nicotine dependence, cigarettes, uncomplicated: Secondary | ICD-10-CM | POA: Insufficient documentation

## 2019-06-18 DIAGNOSIS — Z79899 Other long term (current) drug therapy: Secondary | ICD-10-CM | POA: Insufficient documentation

## 2019-06-18 DIAGNOSIS — R0602 Shortness of breath: Secondary | ICD-10-CM | POA: Diagnosis not present

## 2019-06-18 DIAGNOSIS — Z20822 Contact with and (suspected) exposure to covid-19: Secondary | ICD-10-CM

## 2019-06-18 DIAGNOSIS — R0789 Other chest pain: Secondary | ICD-10-CM | POA: Diagnosis not present

## 2019-06-18 DIAGNOSIS — J1282 Pneumonia due to coronavirus disease 2019: Secondary | ICD-10-CM | POA: Diagnosis not present

## 2019-06-18 DIAGNOSIS — R079 Chest pain, unspecified: Secondary | ICD-10-CM | POA: Diagnosis present

## 2019-06-18 LAB — BASIC METABOLIC PANEL
Anion gap: 15 (ref 5–15)
BUN: 20 mg/dL (ref 8–23)
CO2: 24 mmol/L (ref 22–32)
Calcium: 8.8 mg/dL — ABNORMAL LOW (ref 8.9–10.3)
Chloride: 97 mmol/L — ABNORMAL LOW (ref 98–111)
Creatinine, Ser: 1.36 mg/dL — ABNORMAL HIGH (ref 0.61–1.24)
GFR calc Af Amer: 57 mL/min — ABNORMAL LOW (ref >60)
GFR calc non Af Amer: 49 mL/min — ABNORMAL LOW (ref >60)
Glucose, Bld: 162 mg/dL — ABNORMAL HIGH (ref 70–99)
Potassium: 3.2 mmol/L — ABNORMAL LOW (ref 3.5–5.1)
Sodium: 136 mmol/L (ref 135–145)

## 2019-06-18 LAB — CBC
HCT: 35.9 % — ABNORMAL LOW (ref 39.0–52.0)
Hemoglobin: 12.6 g/dL — ABNORMAL LOW (ref 13.0–17.0)
MCH: 30.6 pg (ref 26.0–34.0)
MCHC: 35.1 g/dL (ref 30.0–36.0)
MCV: 87.1 fL (ref 80.0–100.0)
Platelets: 246 10*3/uL (ref 150–400)
RBC: 4.12 MIL/uL — ABNORMAL LOW (ref 4.22–5.81)
RDW: 12.7 % (ref 11.5–15.5)
WBC: 6.9 10*3/uL (ref 4.0–10.5)
nRBC: 0 % (ref 0.0–0.2)

## 2019-06-18 LAB — POC SARS CORONAVIRUS 2 AG: SARS Coronavirus 2 Ag: NEGATIVE

## 2019-06-18 LAB — TROPONIN I (HIGH SENSITIVITY)
Troponin I (High Sensitivity): 13 ng/L (ref <18)
Troponin I (High Sensitivity): 12 ng/L (ref <18)

## 2019-06-18 MED ORDER — DOXYCYCLINE HYCLATE 100 MG PO TABS
100.0000 mg | ORAL_TABLET | Freq: Once | ORAL | Status: AC
  Administered 2019-06-18: 23:00:00 100 mg via ORAL
  Filled 2019-06-18: qty 1

## 2019-06-18 MED ORDER — ALBUTEROL SULFATE HFA 108 (90 BASE) MCG/ACT IN AERS
2.0000 | INHALATION_SPRAY | Freq: Once | RESPIRATORY_TRACT | Status: AC
  Administered 2019-06-18: 2 via RESPIRATORY_TRACT
  Filled 2019-06-18: qty 6.7

## 2019-06-18 MED ORDER — DOXYCYCLINE HYCLATE 100 MG PO CAPS: 100.0000 mg | ORAL_CAPSULE | Freq: Two times a day (BID) | ORAL | 0 refills | Status: AC

## 2019-06-18 NOTE — ED Notes (Signed)
Family updated as to patient's status.  Spoke with son Marguerite Olea.

## 2019-06-18 NOTE — ED Triage Notes (Signed)
Pt arrived via POV with reports of mid-chest tightness and intermittent SOB x 1 week, pt reports weakness as well.   Pt describes the tightness as a light strain.  No distress noted in triage, pt reports he has had a light cough.

## 2019-06-18 NOTE — ED Provider Notes (Signed)
[facility] Emergency Department Provider Note   ____________________________________________   First MD Initiated Contact with Patient 06/18/19 2112     (approximate)  I have reviewed the triage vital signs and the nursing notes.   HISTORY  Chief Complaint Chest Pain    HPI Ruben Foster is a 79 y.o. male with past medical history of hypertension, diabetes, and GERD who presents to the ED complaining of chest pain and cough.  Patient reports that he has been feeling tightness in the center of his chest for about the past week which has been associated with some difficulty catching his breath at times.  He describes a nonproductive cough as well as some malaise and body aches, but he denies any fevers or chills.  He was advised by his family to get evaluated in the ED after a close family friend recently passed away from COVID-35.  Patient denies that he was exposed to this family friend or anyone else with infectious symptoms.  He has not had any vomiting, diarrhea, or abdominal pain.        Past Medical History:  Diagnosis Date  . Diabetes mellitus without complication ([place])   . GERD (gastroesophageal reflux disease)   . Gout   . Hypertension   . Restless legs     Patient Active Problem List   Diagnosis Date Noted  . Diabetes mellitus, type 2 ([place]) 02/23/2018  . Diabetic neuropathy ([place]) 02/23/2018  . Hypertriglyceridemia 02/23/2018  . Gastroenteritis 10/21/2017  . Vitamin B 12 deficiency 02/27/2017  . Restless leg syndrome, controlled 02/13/2017  . GERD (gastroesophageal reflux disease) 11/04/2016  . ED (erectile dysfunction) 10/04/2013  . Gout 10/04/2013  . HTN (hypertension) 10/04/2013    Past Surgical History:  Procedure Laterality Date  . APPENDECTOMY    . CHOLECYSTECTOMY    . CIRCUMCISION N/A 03/27/2018   Procedure: CIRCUMCISION ADULT;  Surgeon: Abbie Sons, MD;  Location: [facility] ORS;  Service: Urology;  Laterality: N/A;  MAC  & Local anesthesia  . rectal fissure      Prior to Admission medications   Medication Sig Start Date End Date Taking? Authorizing Provider  allopurinol (ZYLOPRIM) 300 MG tablet Take 300 mg by mouth daily.  08/04/17   [provider]  cyanocobalamin 2000 MCG tablet Take 2,000 mcg by mouth daily.    [provider]  doxycycline (VIBRAMYCIN) 100 MG capsule Take 1 capsule (100 mg total) by mouth 2 (two) times daily for 7 days. 06/18/19 06/25/19  Blake Divine, MD  gabapentin (NEURONTIN) 300 MG capsule Take 300 mg by mouth at bedtime.    [provider]  glimepiride (AMARYL) 4 MG tablet Take 4 mg by mouth daily with breakfast.  09/15/17   [provider]  hydrOXYzine (ATARAX/VISTARIL) 10 MG tablet Take 1 tablet (10 mg total) by mouth 3 (three) times daily as needed. 04/25/19   Johnn Hai, PA-C  lisinopril-hydrochlorothiazide (PRINZIDE,ZESTORETIC) 20-25 MG tablet Take 1 tablet by mouth daily. 08/30/17   [provider]  metFORMIN (GLUCOPHAGE) 1000 MG tablet Take 1,000 mg by mouth 2 (two) times daily. 09/22/17   [provider]  Misc Natural Products (URINOZINC PO) Take 1 tablet by mouth daily.    [provider]  pantoprazole (PROTONIX) 40 MG tablet Take 40 mg by mouth daily.     [provider]  potassium chloride SA (KLOR-CON M20) 20 MEQ tablet Take 20 mEq by mouth daily.  05/11/17   [provider]  predniSONE (  DELTASONE) 10 MG tablet Take 3 tablets once a day for 5 days 04/25/19   Johnn Hai, PA-C    Allergies Chlorthalidone and Strawberry extract  Family History  Problem Relation Age of Onset  . Emphysema Father     Social History Social History   Tobacco Use  . Smoking status: Current Some Day Smoker    Types: Pipe  . Smokeless tobacco: Never Used  Substance Use Topics  . Alcohol use: Not Currently  . Drug use: Not Currently    Review of Systems  Constitutional: No fever/chills Eyes: No  visual changes. ENT: No sore throat. Cardiovascular: Positive for chest pain. Respiratory: Positive for cough and shortness of breath. Gastrointestinal: No abdominal pain.  No nausea, no vomiting.  No diarrhea.  No constipation. Genitourinary: Negative for dysuria. Musculoskeletal: Negative for back pain. Skin: Negative for rash. Neurological: Negative for headaches, focal weakness or numbness.  ____________________________________________   PHYSICAL EXAM:  VITAL SIGNS: ED Triage Vitals  Enc Vitals Group     BP 06/18/19 1918 (!) 148/82     Pulse Rate 06/18/19 1918 (!) 105     Resp 06/18/19 1918 20     Temp 06/18/19 1918 98.1 F (36.7 C)     Temp Source 06/18/19 1918 Oral     SpO2 06/18/19 1918 96 %     Weight 06/18/19 1918 203 lb (92.1 kg)     Height 06/18/19 1918 _0  (1.854 m)     Head Circumference --      Peak Flow --      Pain Score 06/18/19 1923 2     Pain Loc --      Pain Edu? --      Excl. in [place]? --     Constitutional: Alert and oriented. Eyes: Conjunctivae are normal. Head: Atraumatic. Nose: No congestion/rhinnorhea. Mouth/Throat: Mucous membranes are moist. Neck: Normal ROM Cardiovascular: Normal rate, regular rhythm. Grossly normal heart sounds. Respiratory: Normal respiratory effort.  No retractions. Lungs CTAB. Gastrointestinal: Soft and nontender. No distention. Genitourinary: deferred Musculoskeletal: No lower extremity tenderness nor edema. Neurologic:  Normal speech and language. No gross focal neurologic deficits are appreciated. Skin:  Skin is warm, dry and intact. No rash noted. Psychiatric: Mood and affect are normal. Speech and behavior are normal.  ____________________________________________   LABS (all labs ordered are listed, but only abnormal results are displayed)  Labs Reviewed  BASIC METABOLIC PANEL - Abnormal; Notable for the following components:      Result Value   Potassium 3.2 (*)    Chloride 97 (*)    Glucose, Bld 162  (*)    Creatinine, Ser 1.36 (*)    Calcium 8.8 (*)    GFR calc non Af Amer 49 (*)    GFR calc Af Amer 57 (*)    All other components within normal limits  CBC - Abnormal; Notable for the following components:   RBC 4.12 (*)    Hemoglobin 12.6 (*)    HCT 35.9 (*)    All other components within normal limits  NOVEL CORONAVIRUS, NAA (HOSP ORDER, SEND-OUT TO REF LAB; TAT 18-24 HRS)  POC SARS CORONAVIRUS 2 AG -  ED  POC SARS CORONAVIRUS 2 AG  TROPONIN I (HIGH SENSITIVITY)  TROPONIN I (HIGH SENSITIVITY)   ____________________________________________  EKG  ED ECG REPORT I, Blake Divine, the attending physician, personally viewed and interpreted this ECG.   Date: 06/19/2019  EKG Time: 19:23  Rate: 99  Rhythm: normal sinus rhythm  Axis: LAD  Intervals:right bundle branch block and left anterior fascicular block  ST&T Change: None   PROCEDURES  Procedure(s) performed (including Critical Care):  Procedures   ____________________________________________   INITIAL IMPRESSION / ASSESSMENT AND PLAN / ED COURSE       79 year old male presents to the ED with 1 week of soreness in his chest associated with cough along with malaise and some difficulty catching his breath.  Symptoms sound most likely infectious in etiology and chest x-ray is concerning for pneumonia.  Patient appears overall well and not in any respiratory distress, maintaining O2 sats on room air.  Vital signs are not consistent with sepsis and lab work is reassuring.  2 sets of troponin are within normal limits and I have a low suspicion for ACS.  Point-of-care testing for COVID-19 was negative and we will follow this up with PCR testing.  I have counseled him on quarantine procedures and in the meantime we will treat for possible bacterial pneumonia with doxycycline.  I have counseled the patient to return to the ED for new or worsening symptoms and otherwise follow-up with his PCP.  Patient agrees with plan.       ____________________________________________   FINAL CLINICAL IMPRESSION(S) / ED DIAGNOSES  Final diagnoses:  Community acquired pneumonia, unspecified laterality  Suspected COVID-19 virus infection     ED Discharge Orders         Ordered    doxycycline (VIBRAMYCIN) 100 MG capsule  2 times daily     06/18/19 2311           Note:  This document was prepared using Dragon voice recognition software and may include unintentional dictation errors.   Blake Divine, MD 06/19/19 [phone]

## 2019-06-20 ENCOUNTER — Telehealth: Payer: Self-pay | Admitting: Emergency Medicine

## 2019-06-20 LAB — NOVEL CORONAVIRUS, NAA (HOSP ORDER, SEND-OUT TO REF LAB; TAT 18-24 HRS): SARS-CoV-2, NAA: DETECTED — AB

## 2019-06-20 NOTE — Telephone Encounter (Signed)
Called patient to inform of positive covid 19 result.  Gave him result.  He is not having any symptoms now, and tomorrow is 10 days after first symptom, so he can come out of isolation tomorrow.  However his wife has been exposed to him and I aske him to have her continue to quarantine at home and monitor for symptoms.

## 2019-06-21 ENCOUNTER — Telehealth (HOSPITAL_COMMUNITY): Payer: Self-pay | Admitting: Physician Assistant

## 2019-06-21 NOTE — Telephone Encounter (Signed)
Called to discuss with Ruben Foster about Covid symptoms and the use of bamlanivimab, a monoclonal antibody infusion for those with mild to moderate Covid symptoms and at a high risk of hospitalization.     Pt is qualified for this infusion at the [facility] infusion center due to co-morbid conditions and/or a member of an at-risk group, however patient is not interested discussing in thing and hung up.   Leanor Kail, PA - C

## 2019-12-20 DIAGNOSIS — M1A0790 Idiopathic chronic gout, unspecified ankle and foot, without tophus (tophi): Secondary | ICD-10-CM | POA: Diagnosis not present

## 2019-12-20 DIAGNOSIS — E781 Pure hyperglyceridemia: Secondary | ICD-10-CM | POA: Diagnosis not present

## 2019-12-20 DIAGNOSIS — E1159 Type 2 diabetes mellitus with other circulatory complications: Secondary | ICD-10-CM | POA: Diagnosis not present

## 2019-12-20 DIAGNOSIS — G2581 Restless legs syndrome: Secondary | ICD-10-CM | POA: Diagnosis not present

## 2019-12-20 DIAGNOSIS — E538 Deficiency of other specified B group vitamins: Secondary | ICD-10-CM | POA: Diagnosis not present

## 2019-12-20 DIAGNOSIS — E559 Vitamin D deficiency, unspecified: Secondary | ICD-10-CM | POA: Diagnosis not present

## 2019-12-20 DIAGNOSIS — Z1329 Encounter for screening for other suspected endocrine disorder: Secondary | ICD-10-CM | POA: Diagnosis not present

## 2019-12-20 DIAGNOSIS — Z0000 Encounter for general adult medical examination without abnormal findings: Secondary | ICD-10-CM | POA: Diagnosis not present

## 2019-12-20 DIAGNOSIS — K219 Gastro-esophageal reflux disease without esophagitis: Secondary | ICD-10-CM | POA: Diagnosis not present

## 2019-12-20 DIAGNOSIS — E1142 Type 2 diabetes mellitus with diabetic polyneuropathy: Secondary | ICD-10-CM | POA: Diagnosis not present

## 2019-12-20 DIAGNOSIS — I152 Hypertension secondary to endocrine disorders: Secondary | ICD-10-CM | POA: Diagnosis not present

## 2019-12-20 DIAGNOSIS — N529 Male erectile dysfunction, unspecified: Secondary | ICD-10-CM | POA: Diagnosis not present

## 2020-06-22 ENCOUNTER — Other Ambulatory Visit: Payer: Self-pay

## 2020-06-22 ENCOUNTER — Encounter: Payer: Self-pay | Admitting: Emergency Medicine

## 2020-06-22 ENCOUNTER — Ambulatory Visit
Admission: EM | Admit: 2020-06-22 | Discharge: 2020-06-22 | Disposition: A | Discharge status: Home / Self Care | Payer: Medicare Other | Attending: Family Medicine | Admitting: Family Medicine

## 2020-06-22 DIAGNOSIS — M436 Torticollis: Secondary | ICD-10-CM

## 2020-06-22 DIAGNOSIS — M25512 Pain in left shoulder: Secondary | ICD-10-CM | POA: Diagnosis not present

## 2020-06-22 MED ORDER — TRAMADOL HCL 50 MG PO TABS: 50.0000 mg | ORAL_TABLET | Freq: Two times a day (BID) | ORAL | 0 refills | Status: DC | PRN

## 2020-06-22 MED ORDER — BACLOFEN 10 MG PO TABS: 10.0000 mg | ORAL_TABLET | Freq: Three times a day (TID) | ORAL | 0 refills | Status: DC | PRN

## 2020-06-22 NOTE — ED Triage Notes (Signed)
Pt presents today with left sided neck/chest pain that began yesterday morning when he rolled over in bed.

## 2020-06-23 NOTE — ED Provider Notes (Signed)
[facility]    CSN: [id number] Arrival date & time: 06/22/20  1522      History   Chief Complaint Chief Complaint  Patient presents with  . Neck Pain    left   HPI   80 year old male presents with the above complaints.  Patient states that this started yesterday morning.  Occurred when he rolled over in bed.  He reports severe left-sided neck pain.  Has extreme difficulty with range of motion secondary to pain.  Rates his pain as 10/10 in severity.  He has taken Tylenol without relief.  Exacerbated by activity/movement.  No relieving factors.  No radicular symptoms.  Denies chest pain.  Denies shortness of breath.  No other associated symptoms.  No other complaints.  Past Medical History:  Diagnosis Date  . Diabetes mellitus without complication ([place])   . GERD (gastroesophageal reflux disease)   . Gout   . Hypertension   . Restless legs     Patient Active Problem List   Diagnosis Date Noted  . Diabetes mellitus, type 2 ([place]) 02/23/2018  . Diabetic neuropathy ([place]) 02/23/2018  . Hypertriglyceridemia 02/23/2018  . Gastroenteritis 10/21/2017  . Vitamin B 12 deficiency 02/27/2017  . Restless leg syndrome, controlled 02/13/2017  . GERD (gastroesophageal reflux disease) 11/04/2016  . ED (erectile dysfunction) 10/04/2013  . Gout 10/04/2013  . HTN (hypertension) 10/04/2013    Past Surgical History:  Procedure Laterality Date  . APPENDECTOMY    . CHOLECYSTECTOMY    . CIRCUMCISION N/A 03/27/2018   Procedure: CIRCUMCISION ADULT;  Surgeon: Abbie Sons, MD;  Location: [facility] ORS;  Service: Urology;  Laterality: N/A;  MAC & Local anesthesia  . rectal fissure         Home Medications    Prior to Admission medications   Medication Sig Start Date End Date Taking? Authorizing Provider  baclofen (LIORESAL) 10 MG tablet Take 1 tablet (10 mg total) by mouth 3 (three) times daily as needed for muscle spasms. 06/22/20  Yes Kayson Bullis G, DO  traMADol (ULTRAM) 50  MG tablet Take 1 tablet (50 mg total) by mouth every 12 (twelve) hours as needed for moderate pain or severe pain. 06/22/20  Yes Saran Laviolette G, DO  allopurinol (ZYLOPRIM) 300 MG tablet Take 300 mg by mouth daily.  08/04/17   [provider]  cyanocobalamin 2000 MCG tablet Take 2,000 mcg by mouth daily.    [provider]  gabapentin (NEURONTIN) 300 MG capsule Take 300 mg by mouth at bedtime.    [provider]  glimepiride (AMARYL) 4 MG tablet Take 4 mg by mouth daily with breakfast.  09/15/17   [provider]  hydrOXYzine (ATARAX/VISTARIL) 10 MG tablet Take 1 tablet (10 mg total) by mouth 3 (three) times daily as needed. 04/25/19   Johnn Hai, PA-C  lisinopril-hydrochlorothiazide (PRINZIDE,ZESTORETIC) 20-25 MG tablet Take 1 tablet by mouth daily. 08/30/17   [provider]  metFORMIN (GLUCOPHAGE) 1000 MG tablet Take 1,000 mg by mouth 2 (two) times daily. 09/22/17   [provider]  Misc Natural Products (URINOZINC PO) Take 1 tablet by mouth daily.    [provider]  pantoprazole (PROTONIX) 40 MG tablet Take 40 mg by mouth daily.     [provider]  potassium chloride SA (KLOR-CON M20) 20 MEQ tablet Take 20 mEq by mouth daily.  05/11/17   [provider]    Family History Family History  Problem Relation Age of Onset  . Emphysema Father  Social History Social History   Tobacco Use  . Smoking status: Current Some Day Smoker    Types: Pipe  . Smokeless tobacco: Never Used  Vaping Use  . Vaping Use: Never used  Substance Use Topics  . Alcohol use: Not Currently  . Drug use: Not Currently     Allergies   Chlorthalidone and Strawberry extract   Review of Systems Review of Systems Per HPI  Physical Exam Triage Vital Signs ED Triage Vitals  Enc Vitals Group     BP 06/22/20 1624 (!) 185/92     Pulse Rate 06/22/20 1624 85     Resp 06/22/20 1624 20     Temp 06/22/20 1624 98.2 F (36.8 C)      Temp src --      SpO2 06/22/20 1624 95 %     Weight 06/22/20 1625 203 lb 0.7 oz (92.1 kg)     Height 06/22/20 1625 _0  (1.854 m)     Head Circumference --      Peak Flow --      Pain Score 06/22/20 1625 10     Pain Loc --      Pain Edu? --      Excl. in [place]? --    Updated Vital Signs BP (!) 185/92 (BP Location: Right Arm)   Pulse 85   Temp 98.2 F (36.8 C)   Resp 20   Ht _1  (1.854 m)   Wt 92.1 kg   SpO2 95%   BMI 26.79 kg/m   Visual Acuity Right Eye Distance:   Left Eye Distance:   Bilateral Distance:    Right Eye Near:   Left Eye Near:    Bilateral Near:     Physical Exam Constitutional:      Comments: Chronically ill-appearing male in no acute distress.  Eyes:     General:        Right eye: No discharge.        Left eye: No discharge.     Conjunctiva/sclera: Conjunctivae normal.  Neck:      Comments: Exquisite tenderness at the labeled location.  Markedly decreased range of motion secondary to pain. Cardiovascular:     Rate and Rhythm: Normal rate and regular rhythm.  Pulmonary:     Effort: Pulmonary effort is normal.     Breath sounds: Normal breath sounds. No wheezing or rales.  Psychiatric:        Mood and Affect: Mood normal.        Behavior: Behavior normal.    UC Treatments / Results  Labs (all labs ordered are listed, but only abnormal results are displayed) Labs Reviewed - No data to display  EKG Interpretation: Normal sinus rhythm with a rate of 93.  Right bundle branch block noted.  Left anterior fascicular block.  LVH.  Unchanged from prior EKG.  Radiology No results found.  Procedures Procedures (including critical care time)  Medications Ordered in UC Medications - No data to display  Initial Impression / Assessment and Plan / UC Course  I have reviewed the triage vital signs and the nursing notes.  Pertinent labs & imaging results that were available during my care of the patient were reviewed by me and considered in my  medical decision making (see chart for details).    80 year old male presents with acute torticollis.  EKG unchanged.  Exam consistent with MSK etiology.  Baclofen as needed.  Tramadol for pain.  Supportive care.  Final Clinical Impressions(s) /  UC Diagnoses   Final diagnoses:  Torticollis, acute   Discharge Instructions   None    ED Prescriptions    Medication Sig Dispense Auth. Provider   baclofen (LIORESAL) 10 MG tablet Take 1 tablet (10 mg total) by mouth 3 (three) times daily as needed for muscle spasms. 30 each Thersa Salt G, DO   traMADol (ULTRAM) 50 MG tablet Take 1 tablet (50 mg total) by mouth every 12 (twelve) hours as needed for moderate pain or severe pain. 10 tablet Thersa Salt G, DO     I have reviewed the PDMP during this encounter.   Thersa Salt [place], [place] 06/23/20 [phone]

## 2020-09-15 ENCOUNTER — Emergency Department: Payer: Medicare Other

## 2020-09-15 ENCOUNTER — Emergency Department
Admission: EM | Admit: 2020-09-15 | Discharge: 2020-09-15 | Disposition: A | Discharge status: Home / Self Care | Payer: Medicare Other | Attending: Emergency Medicine | Admitting: Emergency Medicine

## 2020-09-15 ENCOUNTER — Other Ambulatory Visit: Payer: Self-pay

## 2020-09-15 DIAGNOSIS — R42 Dizziness and giddiness: Secondary | ICD-10-CM | POA: Insufficient documentation

## 2020-09-15 DIAGNOSIS — R110 Nausea: Secondary | ICD-10-CM

## 2020-09-15 DIAGNOSIS — Z79899 Other long term (current) drug therapy: Secondary | ICD-10-CM | POA: Diagnosis not present

## 2020-09-15 DIAGNOSIS — F17290 Nicotine dependence, other tobacco product, uncomplicated: Secondary | ICD-10-CM | POA: Diagnosis not present

## 2020-09-15 DIAGNOSIS — I4510 Unspecified right bundle-branch block: Secondary | ICD-10-CM | POA: Insufficient documentation

## 2020-09-15 DIAGNOSIS — E876 Hypokalemia: Secondary | ICD-10-CM | POA: Diagnosis not present

## 2020-09-15 DIAGNOSIS — E1140 Type 2 diabetes mellitus with diabetic neuropathy, unspecified: Secondary | ICD-10-CM | POA: Insufficient documentation

## 2020-09-15 DIAGNOSIS — Z7984 Long term (current) use of oral hypoglycemic drugs: Secondary | ICD-10-CM | POA: Diagnosis not present

## 2020-09-15 DIAGNOSIS — I10 Essential (primary) hypertension: Secondary | ICD-10-CM | POA: Diagnosis not present

## 2020-09-15 DIAGNOSIS — Z20822 Contact with and (suspected) exposure to covid-19: Secondary | ICD-10-CM | POA: Insufficient documentation

## 2020-09-15 LAB — CBC WITH DIFFERENTIAL/PLATELET
Abs Immature Granulocytes: 0.03 10*3/uL (ref 0.00–0.07)
Basophils Absolute: 0.1 10*3/uL (ref 0.0–0.1)
Basophils Relative: 1 %
Eosinophils Absolute: 0.2 10*3/uL (ref 0.0–0.5)
Eosinophils Relative: 3 %
HCT: 35.5 % — ABNORMAL LOW (ref 39.0–52.0)
Hemoglobin: 12.5 g/dL — ABNORMAL LOW (ref 13.0–17.0)
Immature Granulocytes: 0 %
Lymphocytes Relative: 29 %
Lymphs Abs: 2.3 10*3/uL (ref 0.7–4.0)
MCH: 31.5 pg (ref 26.0–34.0)
MCHC: 35.2 g/dL (ref 30.0–36.0)
MCV: 89.4 fL (ref 80.0–100.0)
Monocytes Absolute: 0.5 10*3/uL (ref 0.1–1.0)
Monocytes Relative: 6 %
Neutro Abs: 4.9 10*3/uL (ref 1.7–7.7)
Neutrophils Relative %: 61 %
Platelets: 227 10*3/uL (ref 150–400)
RBC: 3.97 MIL/uL — ABNORMAL LOW (ref 4.22–5.81)
RDW: 13.4 % (ref 11.5–15.5)
WBC: 8 10*3/uL (ref 4.0–10.5)
nRBC: 0 % (ref 0.0–0.2)

## 2020-09-15 LAB — COMPREHENSIVE METABOLIC PANEL
ALT: 19 U/L (ref 0–44)
AST: 29 U/L (ref 15–41)
Albumin: 3.7 g/dL (ref 3.5–5.0)
Alkaline Phosphatase: 39 U/L (ref 38–126)
Anion gap: 14 (ref 5–15)
BUN: 12 mg/dL (ref 8–23)
CO2: 22 mmol/L (ref 22–32)
Calcium: 8.7 mg/dL — ABNORMAL LOW (ref 8.9–10.3)
Chloride: 101 mmol/L (ref 98–111)
Creatinine, Ser: 1.06 mg/dL (ref 0.61–1.24)
GFR, Estimated: >60 mL/min (ref >60)
Glucose, Bld: 171 mg/dL — ABNORMAL HIGH (ref 70–99)
Potassium: 3.1 mmol/L — ABNORMAL LOW (ref 3.5–5.1)
Sodium: 137 mmol/L (ref 135–145)
Total Bilirubin: 0.8 mg/dL (ref 0.3–1.2)
Total Protein: 6.7 g/dL (ref 6.5–8.1)

## 2020-09-15 LAB — MAGNESIUM: Magnesium: 1.2 mg/dL — ABNORMAL LOW (ref 1.7–2.4)

## 2020-09-15 LAB — RESP PANEL BY RT-PCR (FLU A&B, COVID) ARPGX2
Influenza A by PCR: NEGATIVE
Influenza B by PCR: NEGATIVE
SARS Coronavirus 2 by RT PCR: NEGATIVE

## 2020-09-15 LAB — TROPONIN I (HIGH SENSITIVITY)
Troponin I (High Sensitivity): 18 ng/L — ABNORMAL HIGH (ref <18)
Troponin I (High Sensitivity): 18 ng/L — ABNORMAL HIGH (ref <18)

## 2020-09-15 MED ORDER — MAGNESIUM SULFATE 4 GM/100ML IV SOLN
4.0000 g | Freq: Once | INTRAVENOUS | Status: AC
  Administered 2020-09-15: 4 g via INTRAVENOUS
  Filled 2020-09-15: qty 100

## 2020-09-15 MED ORDER — MAGNESIUM SULFATE 2 GM/50ML IV SOLN
2.0000 g | Freq: Once | INTRAVENOUS | Status: AC
  Administered 2020-09-15: 2 g via INTRAVENOUS
  Filled 2020-09-15: qty 50

## 2020-09-15 MED ORDER — POTASSIUM CHLORIDE CRYS ER 20 MEQ PO TBCR
80.0000 meq | EXTENDED_RELEASE_TABLET | Freq: Once | ORAL | Status: AC
  Administered 2020-09-15: 80 meq via ORAL
  Filled 2020-09-15: qty 4

## 2020-09-15 NOTE — ED Notes (Signed)
Pt presents to ED with c/o of "feeling dizzy and having a weird feeling in my chest". Pt denies chest pain right now and states he "just feels bad". Pt states he was instructed to come here for further eval from a phone call to [facility] clinic. Pt states one episode of dizziness, nausea, and hypertensive. Pt was given 324mg  of ASA and 1 nitro by EM. Pt had c/o of being tired. Pt denies fevers or chills. Pt is A&Ox4. NAD noted.

## 2020-09-15 NOTE — ED Provider Notes (Signed)
[facility] Emergency Department Provider Note  ____________________________________________   Event Date/Time   First MD Initiated Contact with Patient 09/15/20 1514     (approximate)  I have reviewed the triage vital signs and the nursing notes.   HISTORY  Chief Complaint Chest Pain   HPI Ruben Foster is a 80 y.o. male with past medical history of DM, HTN, HDL, GERD, gout, and angina who presents for assessment of an episode of some lightheadedness associate with nausea and diaphoresis and "just not feeling quite right that occurred about an hour prior to arrival.  Patient states he was out on his deck when this occurred and was not significantly exerting himself and denies any associated chest pain.  He states he called his PCP who told him to call 911 and when EMS arrived they gave him nitro and 324 mg of aspirin.  He states that he never had any chest pain the nitroglycerin seem to improve his sensation of "not feeling quite right".  He states he still does not feel completely normal but feels much better than earlier.  He denies any associated headache, earache, sore throat, cough, fevers, shortness of breath, [place] pain, vomiting, diarrhea, dysuria, rash, acute back pain, acute extremity weakness numbness or tingling or any other associated sick symptoms.  No recent injuries or falls.  States he is compliant with all his medications.         Past Medical History:  Diagnosis Date  . Diabetes mellitus without complication ([place])   . GERD (gastroesophageal reflux disease)   . Gout   . Hypertension   . Restless legs     Patient Active Problem List   Diagnosis Date Noted  . Diabetes mellitus, type 2 ([place]) 02/23/2018  . Diabetic neuropathy ([place]) 02/23/2018  . Hypertriglyceridemia 02/23/2018  . Gastroenteritis 10/21/2017  . Vitamin B 12 deficiency 02/27/2017  . Restless leg syndrome, controlled 02/13/2017  . GERD (gastroesophageal reflux disease)  11/04/2016  . ED (erectile dysfunction) 10/04/2013  . Gout 10/04/2013  . HTN (hypertension) 10/04/2013    Past Surgical History:  Procedure Laterality Date  . APPENDECTOMY    . CHOLECYSTECTOMY    . CIRCUMCISION N/A 03/27/2018   Procedure: CIRCUMCISION ADULT;  Surgeon: Abbie Sons, MD;  Location: [facility] ORS;  Service: Urology;  Laterality: N/A;  MAC & Local anesthesia  . rectal fissure      Prior to Admission medications   Medication Sig Start Date End Date Taking? Authorizing Provider  allopurinol (ZYLOPRIM) 300 MG tablet Take 300 mg by mouth daily.  08/04/17   [provider]  baclofen (LIORESAL) 10 MG tablet Take 1 tablet (10 mg total) by mouth 3 (three) times daily as needed for muscle spasms. 06/22/20   Coral Spikes, DO  cyanocobalamin 2000 MCG tablet Take 2,000 mcg by mouth daily.    [provider]  gabapentin (NEURONTIN) 300 MG capsule Take 300 mg by mouth at bedtime.    [provider]  glimepiride (AMARYL) 4 MG tablet Take 4 mg by mouth daily with breakfast.  09/15/17   [provider]  hydrOXYzine (ATARAX/VISTARIL) 10 MG tablet Take 1 tablet (10 mg total) by mouth 3 (three) times daily as needed. 04/25/19   Johnn Hai, PA-C  lisinopril-hydrochlorothiazide (PRINZIDE,ZESTORETIC) 20-25 MG tablet Take 1 tablet by mouth daily. 08/30/17   [provider]  metFORMIN (GLUCOPHAGE) 1000 MG tablet Take 1,000 mg by mouth 2 (two) times daily. 09/22/17   [provider]  Misc Natural Products (URINOZINC PO) Take 1 tablet by mouth daily.    [provider]  pantoprazole (PROTONIX) 40 MG tablet Take 40 mg by mouth daily.     [provider]  potassium chloride SA (KLOR-CON M20) 20 MEQ tablet Take 20 mEq by mouth daily.  05/11/17   [provider]  traMADol (ULTRAM) 50 MG tablet Take 1 tablet (50 mg total) by mouth every 12 (twelve) hours as needed for moderate pain or severe pain. 06/22/20   Coral Spikes, DO     Allergies Chlorthalidone and Strawberry extract  Family History  Problem Relation Age of Onset  . Emphysema Father     Social History Social History   Tobacco Use  . Smoking status: Current Some Day Smoker    Types: Pipe  . Smokeless tobacco: Never Used  Vaping Use  . Vaping Use: Never used  Substance Use Topics  . Alcohol use: Not Currently  . Drug use: Not Currently    Review of Systems  Review of Systems  Constitutional: Positive for diaphoresis. Negative for chills and fever.  HENT: Negative for sore throat.   Eyes: Negative for pain.  Respiratory: Negative for cough and stridor.   Gastrointestinal: Positive for nausea. Negative for vomiting.  Genitourinary: Negative for dysuria.  Musculoskeletal: Negative for myalgias.  Skin: Negative for rash.  Neurological: Positive for dizziness. Negative for seizures, loss of consciousness and headaches.  Psychiatric/Behavioral: Negative for suicidal ideas.  All other systems reviewed and are negative.     ____________________________________________   PHYSICAL EXAM:  VITAL SIGNS: ED Triage Vitals  Enc Vitals Group     BP      Pulse      Resp      Temp      Temp src      SpO2      Weight      Height      Head Circumference      Peak Flow      Pain Score      Pain Loc      Pain Edu?      Excl. in [place]?    Vitals:   09/15/20 1700 09/15/20 1730  BP: (!) 145/83 (!) 145/83  Pulse: 83 84  Resp: 15 17  Temp:    SpO2: 94% 95%   Physical Exam Vitals and nursing note reviewed.  Constitutional:      Appearance: He is well-developed.  HENT:     Head: Normocephalic and atraumatic.     Right Ear: External ear normal.     Left Ear: External ear normal.     Nose: Nose normal.  Eyes:     Conjunctiva/sclera: Conjunctivae normal.  Cardiovascular:     Rate and Rhythm: Normal rate and regular rhythm.     Heart sounds: No murmur heard.   Pulmonary:     Effort: Pulmonary effort is normal. No respiratory  distress.     Breath sounds: Normal breath sounds.  Abdominal:     Palpations: Abdomen is soft.     Tenderness: There is no abdominal tenderness.  Musculoskeletal:     Cervical back: Neck supple.  Skin:    General: Skin is warm and dry.     Capillary Refill: Capillary refill takes less than 2 seconds.  Neurological:     Mental Status: He is alert and oriented to person, place, and time.  Psychiatric:        Mood and Affect: Mood normal.  ____________________________________________   LABS (all labs ordered are listed, but only abnormal results are displayed)  Labs Reviewed  CBC WITH DIFFERENTIAL/PLATELET - Abnormal; Notable for the following components:      Result Value   RBC 3.97 (*)    Hemoglobin 12.5 (*)    HCT 35.5 (*)    All other components within normal limits  COMPREHENSIVE METABOLIC PANEL - Abnormal; Notable for the following components:   Potassium 3.1 (*)    Glucose, Bld 171 (*)    Calcium 8.7 (*)    All other components within normal limits  MAGNESIUM - Abnormal; Notable for the following components:   Magnesium 1.2 (*)    All other components within normal limits  TROPONIN I (HIGH SENSITIVITY) - Abnormal; Notable for the following components:   Troponin I (High Sensitivity) 18 (*)    All other components within normal limits  TROPONIN I (HIGH SENSITIVITY) - Abnormal; Notable for the following components:   Troponin I (High Sensitivity) 18 (*)    All other components within normal limits  RESP PANEL BY RT-PCR (FLU A&B, COVID) ARPGX2   ____________________________________________  EKG Sinus rhythm with ventricular 93, left axis deviation, prolonged QTc interval at 537, QRS is prolonged at 153, nonspecific ST changes in aVL, anterior leads V2, V1 and V3 without other evidence of acute ischemia or other significant Arrhythmia. ____________________________________________  RADIOLOGY  ED MD interpretation: No full consolidation, pneumothorax,  effusion, edema or other clear acute intrathoracic process.  Official radiology report(s): DG Chest 2 View  Result Date: 09/15/2020 CLINICAL DATA:  Chest pain. EXAM: CHEST - 2 VIEW COMPARISON:  11/16/2019 FINDINGS: The cardiac silhouette, mediastinal and hilar contours are within normal limits and stable. The lungs are clear of an acute process. Streaky basilar scarring changes are noted. No pleural effusions or pulmonary lesions. No pneumothorax. The bony thorax is intact. IMPRESSION: No acute cardiopulmonary findings. Electronically Signed   By: Marijo Sanes M.D.   On: 09/15/2020 16:06    ____________________________________________   PROCEDURES  Procedure(s) performed (including Critical Care):  .1-3 Lead EKG Interpretation Performed by: Lucrezia Starch, MD Authorized by: Lucrezia Starch, MD     Interpretation: normal     ECG rate assessment: normal     Rhythm: sinus rhythm     Ectopy: none     Conduction: abnormal       ____________________________________________   INITIAL IMPRESSION / ASSESSMENT AND PLAN / ED COURSE      Patient presents for a post history exam for assessment of an episode of nausea lightheadedness diaphoresis that occurred earlier today and seemed to get a little better with nitroglycerin received with EMS.  Per report nitroglycerin did drop his blood pressure a little bit but on arrival he is hypertensive with BP of 151/98 otherwise stable vital signs on room air.  Differential includes ACS, metabolic derangements, arrhythmia, symptomatic anemia, acute infectious process i.e. pneumonia, and CVA.  ECG has similar right bundle branch block pattern with prolonged QTc interval and some nonspecific changes in anterior leads and lateral leads I and aVL.  Patient is noted to have right bundle branch block and left anterior fascicle block with a prolonged QTC on prior ECG on 06/22/2020.  Will ECG has nonspecific findings troponin is stable x2 at 18 and  given patient denies any chest pain elicitation for ACS or myocarditis.  CBC shows no leukocytosis or acute anemia.  CMP shows K of 3.1 with no other significant electrolyte or metabolic derangements.  Magnesium is low at 1.2.  COVID and flu are negative.  Chest x-ray is unremarkable and patient has no focal deficits to suggest CVA.  No significant rhythm identified on ECG.  Suspect likely symptomatic hypokalemia and hypomagnesemia.  These were repleted.  Patient stated he felt much better on reassessment.  Given stable vitals laceration exam and work-up I think is safe for discharge plan for outpatient PCP follow-up in the next 24 to 48 hours.  Discharged stable condition.  Strict return precautions advised and discussed.      ____________________________________________   FINAL CLINICAL IMPRESSION(S) / ED DIAGNOSES  Final diagnoses:  Hypomagnesemia  Hypokalemia  Nausea  Lightheaded    Medications  magnesium sulfate IVPB 4 g 100 mL (4 g Intravenous New Bag/Given 09/15/20 1739)  magnesium sulfate IVPB 2 g 50 mL (0 g Intravenous Stopped 09/15/20 1715)  potassium chloride SA (KLOR-CON) CR tablet 80 mEq (80 mEq Oral Given 09/15/20 1622)     ED Discharge Orders    None       Note:  This document was prepared using Dragon voice recognition software and may include unintentional dictation errors.   Lucrezia Starch, MD 09/15/20 [phone]

## 2020-09-15 NOTE — ED Notes (Signed)
Sig pad not working, MD at bedside

## 2021-01-02 ENCOUNTER — Encounter: Payer: Self-pay | Admitting: Emergency Medicine

## 2021-01-02 ENCOUNTER — Ambulatory Visit
Admission: EM | Admit: 2021-01-02 | Discharge: 2021-01-02 | Disposition: A | Discharge status: Home / Self Care | Payer: Medicare Other | Attending: Physician Assistant | Admitting: Physician Assistant

## 2021-01-02 ENCOUNTER — Ambulatory Visit (INDEPENDENT_AMBULATORY_CARE_PROVIDER_SITE_OTHER): Payer: Medicare Other

## 2021-01-02 ENCOUNTER — Other Ambulatory Visit: Payer: Self-pay

## 2021-01-02 DIAGNOSIS — M62830 Muscle spasm of back: Secondary | ICD-10-CM | POA: Diagnosis present

## 2021-01-02 DIAGNOSIS — M549 Dorsalgia, unspecified: Secondary | ICD-10-CM | POA: Diagnosis not present

## 2021-01-02 DIAGNOSIS — R109 Unspecified abdominal pain: Secondary | ICD-10-CM | POA: Diagnosis present

## 2021-01-02 LAB — POCT URINALYSIS DIP (DEVICE)
Bilirubin Urine: NEGATIVE
Glucose, UA: NEGATIVE mg/dL
Hgb urine dipstick: NEGATIVE
Ketones, ur: 40 mg/dL — AB
Leukocytes,Ua: NEGATIVE
Nitrite: NEGATIVE
Protein, ur: NEGATIVE mg/dL
Specific Gravity, Urine: >=1.03 (ref 1.005–1.030)
Urobilinogen, UA: 0.2 mg/dL (ref 0.0–1.0)
pH: 5 (ref 5.0–8.0)

## 2021-01-02 MED ORDER — TRAMADOL HCL 50 MG PO TABS: 50.0000 mg | ORAL_TABLET | Freq: Three times a day (TID) | ORAL | 0 refills | Status: AC | PRN

## 2021-01-02 MED ORDER — BACLOFEN 10 MG PO TABS: 10.0000 mg | ORAL_TABLET | Freq: Three times a day (TID) | ORAL | 0 refills | Status: AC | PRN

## 2021-01-02 MED ORDER — MELOXICAM 7.5 MG PO TABS: 7.5000 mg | ORAL_TABLET | Freq: Every day | ORAL | 0 refills | Status: AC

## 2021-01-02 MED ORDER — KETOROLAC TROMETHAMINE 60 MG/2ML IM SOLN
30.0000 mg | Freq: Once | INTRAMUSCULAR | Status: AC
  Administered 2021-01-02: 30 mg via INTRAMUSCULAR

## 2021-01-02 NOTE — ED Provider Notes (Signed)
[facility]    CSN: [id number] Arrival date & time: 01/02/21  0802      History   Chief Complaint Chief Complaint  Patient presents with   Back Pain    HPI Ruben Foster is a 80 y.o. male presenting for acute onset of left flank/mid to low back pain at 8:30 PM last night.  Patient states that every 5 minutes or so he gets a sharp and squeezing pain in his right flank.  He says that last for a couple of minutes and then goes away.  He cannot think of anything that brings on this pain.  Nothing is seem to relieve the pain either.  He has tried icing the back and also taking 2 extra strength Tylenol without any improvement.  He denies any radiation of the pain to his lower extremities, chest or abdomen.  No associated fever, nausea/vomiting, dysuria, hematuria.  No increased pain on breathing.  Full range of motion of his back without any increased pain.  He denies any injuries.  Patient says he has had muscle spasms in the past of his neck and it feels similar.  He says that he was looking for muscle relaxers he had at home but could not find any.  He was prescribed some about 7 months ago for his neck and says they helped.  Patient denies any significant problems with his back.  Is any history of kidney stones but says he has a strong family history of kidney stones.  Past medical history significant for hypertension, gout, type 2 diabetes with diabetic neuropathy, hyperlipidemia and erectile dysfunction.  HPI  Past Medical History:  Diagnosis Date   Diabetes mellitus without complication ([place])    GERD (gastroesophageal reflux disease)    Gout    Hypertension    Restless legs     Patient Active Problem List   Diagnosis Date Noted   Diabetes mellitus, type 2 ([place]) 02/23/2018   Diabetic neuropathy ([place]) 02/23/2018   Hypertriglyceridemia 02/23/2018   Gastroenteritis 10/21/2017   Vitamin B 12 deficiency 02/27/2017   Restless leg syndrome, controlled 02/13/2017   GERD  (gastroesophageal reflux disease) 11/04/2016   ED (erectile dysfunction) 10/04/2013   Gout 10/04/2013   HTN (hypertension) 10/04/2013    Past Surgical History:  Procedure Laterality Date   APPENDECTOMY     CHOLECYSTECTOMY     CIRCUMCISION N/A 03/27/2018   Procedure: CIRCUMCISION ADULT;  Surgeon: Abbie Sons, MD;  Location: [facility] ORS;  Service: Urology;  Laterality: N/A;  MAC & Local anesthesia   rectal fissure         Home Medications    Prior to Admission medications   Medication Sig Start Date End Date Taking? Authorizing Provider  meloxicam (MOBIC) 7.5 MG tablet Take 1 tablet (7.5 mg total) by mouth daily for 15 days. 01/02/21 01/17/21 Yes Danton Clap, PA-C  traMADol (ULTRAM) 50 MG tablet Take 1 tablet (50 mg total) by mouth every 8 (eight) hours as needed for up to 3 days. 01/02/21 01/05/21 Yes Danton Clap, PA-C  allopurinol (ZYLOPRIM) 300 MG tablet Take 300 mg by mouth daily.  08/04/17   [provider]  baclofen (LIORESAL) 10 MG tablet Take 1 tablet (10 mg total) by mouth 3 (three) times daily as needed for up to 7 days for muscle spasms. 01/02/21 01/09/21  Danton Clap, PA-C  cyanocobalamin 2000 MCG tablet Take 2,000 mcg by mouth daily.    [provider]  gabapentin (NEURONTIN) 300  MG capsule Take 300 mg by mouth at bedtime.    [provider]  glimepiride (AMARYL) 4 MG tablet Take 4 mg by mouth daily with breakfast.  09/15/17   [provider]  hydrOXYzine (ATARAX/VISTARIL) 10 MG tablet Take 1 tablet (10 mg total) by mouth 3 (three) times daily as needed. 04/25/19   Johnn Hai, PA-C  lisinopril-hydrochlorothiazide (PRINZIDE,ZESTORETIC) 20-25 MG tablet Take 1 tablet by mouth daily. 08/30/17   [provider]  metFORMIN (GLUCOPHAGE) 1000 MG tablet Take 1,000 mg by mouth 2 (two) times daily. 09/22/17   [provider]  Misc Natural Products (URINOZINC PO) Take 1 tablet by mouth daily.    [provider]   pantoprazole (PROTONIX) 40 MG tablet Take 40 mg by mouth daily.     [provider]  potassium chloride SA (KLOR-CON M20) 20 MEQ tablet Take 20 mEq by mouth daily.  05/11/17   [provider]    Family History Family History  Problem Relation Age of Onset   Emphysema Father     Social History Social History   Tobacco Use   Smoking status: Some Days    Types: Pipe   Smokeless tobacco: Never  Vaping Use   Vaping Use: Never used  Substance Use Topics   Alcohol use: Not Currently   Drug use: Not Currently     Allergies   Chlorthalidone and Strawberry extract   Review of Systems Review of Systems  Constitutional:  Negative for fatigue and fever.  Respiratory:  Negative for chest tightness and shortness of breath.   Cardiovascular:  Negative for chest pain.  Gastrointestinal:  Negative for abdominal pain, nausea and vomiting.  Genitourinary:  Positive for flank pain. Negative for dysuria and hematuria.  Musculoskeletal:  Positive for back pain.  Skin:  Negative for rash.  Neurological:  Negative for weakness and numbness.    Physical Exam Triage Vital Signs ED Triage Vitals  Enc Vitals Group     BP 01/02/21 0821 (!) 154/77     Pulse Rate 01/02/21 0821 78     Resp --      Temp 01/02/21 0821 97.9 F (36.6 C)     Temp Source 01/02/21 0821 Oral     SpO2 01/02/21 0821 98 %     Weight --      Height --      Head Circumference --      Peak Flow --      Pain Score 01/02/21 0817 0     Pain Loc --      Pain Edu? --      Excl. in [place]? --    No data found.  Updated Vital Signs BP (!) 154/77 (BP Location: Right Arm)   Pulse 78   Temp 97.9 F (36.6 C) (Oral)   SpO2 98%      Physical Exam Vitals and nursing note reviewed.  Constitutional:      General: He is not in acute distress.    Appearance: Normal appearance. He is well-developed. He is not ill-appearing.  HENT:     Head: Normocephalic and atraumatic.  Eyes:     General: No scleral  icterus.    Conjunctiva/sclera: Conjunctivae normal.  Cardiovascular:     Rate and Rhythm: Normal rate and regular rhythm.     Heart sounds: No murmur heard. Pulmonary:     Effort: Pulmonary effort is normal. No respiratory distress.     Breath sounds: Normal breath sounds.  Abdominal:  Palpations: Abdomen is soft.     Tenderness: There is no abdominal tenderness. There is right CVA tenderness. There is no left CVA tenderness.  Musculoskeletal:     Cervical back: Neck supple.     Lumbar back: Tenderness (TTP right thoracolumbar region) present. No bony tenderness. Normal range of motion. Negative right straight leg raise test and negative left straight leg raise test.       Back:  Skin:    General: Skin is warm and dry.  Neurological:     General: No focal deficit present.     Mental Status: He is alert. Mental status is at baseline.     Motor: No weakness.     Gait: Gait normal.  Psychiatric:        Mood and Affect: Mood normal.        Behavior: Behavior normal.        Thought Content: Thought content normal.     UC Treatments / Results  Labs (all labs ordered are listed, but only abnormal results are displayed) Labs Reviewed  POCT URINALYSIS DIP (DEVICE) - Abnormal; Notable for the following components:      Result Value   Ketones, ur 40 (*)    All other components within normal limits  POCT URINALYSIS DIPSTICK, ED / UC    EKG   Radiology DG Abdomen 1 View  Result Date: 01/02/2021 CLINICAL DATA:  Right flank/back pain. EXAM: ABDOMEN - 1 VIEW COMPARISON:  10/21/2017 FINDINGS: Normal bowel gas pattern. Small calcifications in the pelvis are suggestive for phleboliths. Evidence for cholecystectomy clips. No large renal calcifications but limited evaluation due to overlying bowel gas. IMPRESSION: No acute abnormality. Electronically Signed   By: Markus Daft M.D.   On: 01/02/2021 09:18    Procedures Procedures (including critical care time)  Medications Ordered in  UC Medications  ketorolac (TORADOL) injection 30 mg (30 mg Intramuscular Given 01/02/21 0855)    Initial Impression / Assessment and Plan / UC Course  I have reviewed the triage vital signs and the nursing notes.  Pertinent labs & imaging results that were available during my care of the patient were reviewed by me and considered in my medical decision making (see chart for details).   80 y/o male presenting for right flank/back pain since last night.  Patient given 30 mg IM ketorolac for pain. BMP reviewed from 09/17/20 and GFR 58, Cr 1.2  UA obtained: Positive ketones, otherwise negative.  KUB obtained out of suspicious for possible nephrolithiasis.  Reviewed imaging.  No evidence of stones.  Reviewed this with patient.  Patient did report he had some relief with the Toradol injection.  Advised him that his pain seems due to muscle spasms.  I have sent him Mobic, baclofen, and Ultram.  Reviewed controlled substance database and find patient to be low risk for abuse.  Also advised him to use lidocaine patches and moist heat.  Advised to follow-up with PCP if not improving over the next 7 to 10 days.  ED precautions reviewed.   Final Clinical Impressions(s) / UC Diagnoses   Final diagnoses:  Muscle spasm of back  Right flank pain     Discharge Instructions      The urine looks good and the x-ray is not indicative of any kidney stones.  The pain is likely due to muscle spasms.  I have sent a couple medications for you including baclofen which is a muscle relaxer, meloxicam which is an anti-inflammatory and tramadol which is  a pain medication.  BACK PAIN: Stressed avoiding painful activities . RICE (REST, ICE, COMPRESSION, ELEVATION) guidelines reviewed. May alternate ice and heat. Consider use of muscle rubs, Salonpas patches, etc. Use medications as directed including muscle relaxers if prescribed. Take anti-inflammatory medications as prescribed or OTC NSAIDs/Tylenol.  F/u with PCP in  7-10 days for reexamination, and please feel free to call or return to the urgent care at any time for any questions or concerns you may have and we will be happy to help you!   BACK PAIN RED FLAGS: If the back pain acutely worsens or there are any red flag symptoms such as numbness/tingling, leg weakness, saddle anesthesia, or loss of bowel/bladder control, go immediately to the ER. Follow up with [place] as scheduled or sooner if the pain does not begin to resolve or if it worsens before the follow up       ED Prescriptions     Medication Sig Dispense Auth. Provider   baclofen (LIORESAL) 10 MG tablet Take 1 tablet (10 mg total) by mouth 3 (three) times daily as needed for up to 7 days for muscle spasms. 20 each Danton Clap, PA-C   meloxicam (MOBIC) 7.5 MG tablet Take 1 tablet (7.5 mg total) by mouth daily for 15 days. 15 tablet Laurene Footman B, PA-C   traMADol (ULTRAM) 50 MG tablet Take 1 tablet (50 mg total) by mouth every 8 (eight) hours as needed for up to 3 days. 8 tablet Danton Clap, PA-C      I have reviewed the PDMP during this encounter.   Danton Clap, PA-C 01/02/21 [phone]
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
## 2021-01-02 NOTE — Discharge Instructions (Addendum)
The urine looks good and the x-ray is not indicative of any kidney stones.  The pain is likely due to muscle spasms.  I have sent a couple medications for you including baclofen which is a muscle relaxer, meloxicam which is an anti-inflammatory and tramadol which is a pain medication.  BACK PAIN: Stressed avoiding painful activities . RICE (REST, ICE, COMPRESSION, ELEVATION) guidelines reviewed. May alternate ice and heat. Consider use of muscle rubs, Salonpas patches, etc. Use medications as directed including muscle relaxers if prescribed. Take anti-inflammatory medications as prescribed or OTC NSAIDs/Tylenol.  F/u with PCP in 7-10 days for reexamination, and please feel free to call or return to the urgent care at any time for any questions or concerns you may have and we will be happy to help you!   BACK PAIN RED FLAGS: If the back pain acutely worsens or there are any red flag symptoms such as numbness/tingling, leg weakness, saddle anesthesia, or loss of bowel/bladder control, go immediately to the ER. Follow up with [place] as scheduled or sooner if the pain does not begin to resolve or if it worsens before the follow up

## 2021-01-02 NOTE — ED Notes (Signed)
Pt unable to void at this time. Given cup of water.

## 2022-01-12 ENCOUNTER — Ambulatory Visit (INDEPENDENT_AMBULATORY_CARE_PROVIDER_SITE_OTHER): Payer: Medicare Other

## 2022-01-12 ENCOUNTER — Encounter: Payer: Self-pay | Admitting: Emergency Medicine

## 2022-01-12 ENCOUNTER — Ambulatory Visit
Admission: EM | Admit: 2022-01-12 | Discharge: 2022-01-12 | Disposition: A | Discharge status: Home / Self Care | Payer: Medicare Other | Attending: Emergency Medicine | Admitting: Emergency Medicine

## 2022-01-12 DIAGNOSIS — K5900 Constipation, unspecified: Secondary | ICD-10-CM | POA: Diagnosis not present

## 2022-01-12 DIAGNOSIS — E871 Hypo-osmolality and hyponatremia: Secondary | ICD-10-CM | POA: Diagnosis present

## 2022-01-12 DIAGNOSIS — K5903 Drug induced constipation: Secondary | ICD-10-CM | POA: Diagnosis present

## 2022-01-12 DIAGNOSIS — E876 Hypokalemia: Secondary | ICD-10-CM | POA: Diagnosis present

## 2022-01-12 LAB — COMPREHENSIVE METABOLIC PANEL
ALT: 17 U/L (ref 0–44)
AST: 21 U/L (ref 15–41)
Albumin: 4.5 g/dL (ref 3.5–5.0)
Alkaline Phosphatase: 42 U/L (ref 38–126)
Anion gap: 12 (ref 5–15)
BUN: 14 mg/dL (ref 8–23)
CO2: 26 mmol/L (ref 22–32)
Calcium: 9.7 mg/dL (ref 8.9–10.3)
Chloride: 95 mmol/L — ABNORMAL LOW (ref 98–111)
Creatinine, Ser: 1.34 mg/dL — ABNORMAL HIGH (ref 0.61–1.24)
GFR, Estimated: 54 mL/min — ABNORMAL LOW (ref >60)
Glucose, Bld: 181 mg/dL — ABNORMAL HIGH (ref 70–99)
Potassium: 3.3 mmol/L — ABNORMAL LOW (ref 3.5–5.1)
Sodium: 133 mmol/L — ABNORMAL LOW (ref 135–145)
Total Bilirubin: 0.8 mg/dL (ref 0.3–1.2)
Total Protein: 7.9 g/dL (ref 6.5–8.1)

## 2022-01-12 LAB — CBC WITH DIFFERENTIAL/PLATELET
Abs Immature Granulocytes: 0.02 10*3/uL (ref 0.00–0.07)
Basophils Absolute: 0.1 10*3/uL (ref 0.0–0.1)
Basophils Relative: 1 %
Eosinophils Absolute: 0.2 10*3/uL (ref 0.0–0.5)
Eosinophils Relative: 2 %
HCT: 40.3 % (ref 39.0–52.0)
Hemoglobin: 14 g/dL (ref 13.0–17.0)
Immature Granulocytes: 0 %
Lymphocytes Relative: 29 %
Lymphs Abs: 2.3 10*3/uL (ref 0.7–4.0)
MCH: 31.5 pg (ref 26.0–34.0)
MCHC: 34.7 g/dL (ref 30.0–36.0)
MCV: 90.6 fL (ref 80.0–100.0)
Monocytes Absolute: 0.5 10*3/uL (ref 0.1–1.0)
Monocytes Relative: 7 %
Neutro Abs: 5 10*3/uL (ref 1.7–7.7)
Neutrophils Relative %: 61 %
Platelets: 319 10*3/uL (ref 150–400)
RBC: 4.45 MIL/uL (ref 4.22–5.81)
RDW: 13.2 % (ref 11.5–15.5)
WBC: 8 10*3/uL (ref 4.0–10.5)
nRBC: 0 % (ref 0.0–0.2)

## 2022-01-12 NOTE — ED Provider Notes (Signed)
[facility]    CSN: [id number] Arrival date & time: 01/12/22  0803      History   Chief Complaint Chief Complaint  Patient presents with   Constipation    HPI Ruben Foster is a 81 y.o. male.   HPI  81 year old male here for evaluation of GI complaints.  Patient reports that for the last month he has been experiencing bloating and constipation.  He has been using MiraLAX, magnesium citrate, and Dulcolax suppositories with very mild relief.  He has had nausea but denies any vomiting.  He states he took some MiraLAX last night and a suppository this morning which allowed him to have a small burst of liquid stool.  He is unsure of when his last formed stool was.  He denies any fever.  He describes feeling intense fullness in his upper abdomen.  He endorses some shortness of breath when he feels nauseous but he denies any chest pain.  Patient was admitted to [facility] emergency department on 12/12/2021 and had a CT a of his chest as well as a CT abdomen pelvis.  The CT abdomen pelvis showed significant fecal loading.  He also had a CBC, hepatic panel, and lipase levels that were all normal.  Past Medical History:  Diagnosis Date   Diabetes mellitus without complication (HCC)    GERD (gastroesophageal reflux disease)    Gout    Hypertension    Restless legs     Patient Active Problem List   Diagnosis Date Noted   Diabetes mellitus, type 2 ([place]) 02/23/2018   Diabetic neuropathy ([place]) 02/23/2018   Hypertriglyceridemia 02/23/2018   Gastroenteritis 10/21/2017   Vitamin B 12 deficiency 02/27/2017   Restless leg syndrome, controlled 02/13/2017   GERD (gastroesophageal reflux disease) 11/04/2016   ED (erectile dysfunction) 10/04/2013   Gout 10/04/2013   HTN (hypertension) 10/04/2013    Past Surgical History:  Procedure Laterality Date   APPENDECTOMY     CHOLECYSTECTOMY     CIRCUMCISION N/A 03/27/2018   Procedure: CIRCUMCISION ADULT;  Surgeon:  Abbie Sons, MD;  Location: [facility] ORS;  Service: Urology;  Laterality: N/A;  MAC & Local anesthesia   rectal fissure         Home Medications    Prior to Admission medications   Medication Sig Start Date End Date Taking? Authorizing Provider  allopurinol (ZYLOPRIM) 300 MG tablet Take 300 mg by mouth daily.  08/04/17   [provider]  cyanocobalamin 2000 MCG tablet Take 2,000 mcg by mouth daily.    [provider]  gabapentin (NEURONTIN) 300 MG capsule Take 300 mg by mouth at bedtime.    [provider]  glimepiride (AMARYL) 4 MG tablet Take 4 mg by mouth daily with breakfast.  09/15/17   [provider]  hydrOXYzine (ATARAX/VISTARIL) 10 MG tablet Take 1 tablet (10 mg total) by mouth 3 (three) times daily as needed. 04/25/19   Johnn Hai, PA-C  lisinopril-hydrochlorothiazide (PRINZIDE,ZESTORETIC) 20-25 MG tablet Take 1 tablet by mouth daily. 08/30/17   [provider]  metFORMIN (GLUCOPHAGE) 1000 MG tablet Take 1,000 mg by mouth 2 (two) times daily. 09/22/17   [provider]  Misc Natural Products (URINOZINC PO) Take 1 tablet by mouth daily.    [provider]  pantoprazole (PROTONIX) 40 MG tablet Take 40 mg by mouth daily.     [provider]  potassium chloride SA (KLOR-CON M20) 20 MEQ tablet Take 20 mEq by mouth daily.  05/11/17   [provider]    Family History Family History  Problem Relation Age of Onset   Emphysema Father     Social History Social History   Tobacco Use   Smoking status: Some Days    Types: Pipe   Smokeless tobacco: Never  Vaping Use   Vaping Use: Never used  Substance Use Topics   Alcohol use: Not Currently   Drug use: Not Currently     Allergies   Chlorthalidone and Strawberry extract   Review of Systems Review of Systems  Constitutional:  Negative for fever.  Respiratory:  Positive for shortness of breath.   Cardiovascular:  Negative for chest pain.   Gastrointestinal:  Positive for abdominal distention, abdominal pain, constipation, diarrhea and nausea. Negative for vomiting.  Neurological:  Positive for headaches.  Hematological: Negative.      Physical Exam Triage Vital Signs ED Triage Vitals  Enc Vitals Group     BP 01/12/22 0817 (!) 141/70     Pulse Rate 01/12/22 0817 80     Resp 01/12/22 0817 16     Temp 01/12/22 0817 97.9 F (36.6 C)     Temp Source 01/12/22 0817 Oral     SpO2 01/12/22 0817 100 %     Weight --      Height --      Head Circumference --      Peak Flow --      Pain Score 01/12/22 0815 3     Pain Loc --      Pain Edu? --      Excl. in [place]? --    No data found.  Updated Vital Signs BP (!) 141/70 (BP Location: Left Arm)   Pulse 80   Temp 97.9 F (36.6 C) (Oral)   Resp 16   SpO2 100%   Visual Acuity Right Eye Distance:   Left Eye Distance:   Bilateral Distance:    Right Eye Near:   Left Eye Near:    Bilateral Near:     Physical Exam Vitals and nursing note reviewed.  Constitutional:      Appearance: Normal appearance. He is ill-appearing.  HENT:     Head: Normocephalic and atraumatic.  Cardiovascular:     Rate and Rhythm: Normal rate and regular rhythm.     Pulses: Normal pulses.     Heart sounds: Normal heart sounds. No murmur heard.    No friction rub. No gallop.  Pulmonary:     Effort: Pulmonary effort is normal.     Breath sounds: Normal breath sounds. No wheezing, rhonchi or rales.  Abdominal:     General: Abdomen is flat. Bowel sounds are normal. There is distension.     Palpations: Abdomen is soft.     Tenderness: There is abdominal tenderness. There is no guarding or rebound.  Skin:    General: Skin is warm and dry.     Capillary Refill: Capillary refill takes less than 2 seconds.     Findings: No erythema or rash.  Neurological:     General: No focal deficit present.     Mental Status: He is alert and oriented to person, place, and time.  Psychiatric:        Mood and  Affect: Mood normal.        Behavior: Behavior normal.        Thought Content: Thought content normal.        Judgment: Judgment normal.      UC  Treatments / Results  Labs (all labs ordered are listed, but only abnormal results are displayed) Labs Reviewed  COMPREHENSIVE METABOLIC PANEL - Abnormal; Notable for the following components:      Result Value   Sodium 133 (*)    Potassium 3.3 (*)    Chloride 95 (*)    Glucose, Bld 181 (*)    Creatinine, Ser 1.34 (*)    GFR, Estimated 54 (*)    All other components within normal limits  CBC WITH DIFFERENTIAL/PLATELET    EKG   Radiology DG Abd 2 Views  Result Date: 01/12/2022 CLINICAL DATA:  Constipation and bloating for 1 month EXAM: ABDOMEN - 2 VIEW COMPARISON:  January 02, 2021 FINDINGS: The bowel gas pattern is normal. Moderate colonic stool burden. Cholecystectomy clips in the right upper quadrant. There is no evidence of free air. No radio-opaque calculi or other significant radiographic abnormality is seen. IMPRESSION: Normal bowel gas pattern with moderate colonic stool burden. Electronically Signed   By: Beryle Flock M.D.   On: 01/12/2022 09:04    Procedures Procedures (including critical care time)  Medications Ordered in UC Medications - No data to display  Initial Impression / Assessment and Plan / UC Course  I have reviewed the triage vital signs and the nursing notes.  Pertinent labs & imaging results that were available during my care of the patient were reviewed by me and considered in my medical decision making (see chart for details).  Clinical Course as of 01/12/22 1000  Wed Jan 12, 2022  0954 Glucose(!): 181 [JR]    Clinical Course User Index [JR] Margarette Canada, NP  Patient is a pleasant, though ill-appearing, 81 year old male here for evaluation of an abdominal bloating and constipation has been going on for a month.  Exactly 1 month ago he was evaluated in an emergency department in [place] had a  CT of his abdomen pelvis that showed significant fecal loading.  He was discharged home on magnesium citrate and Dulcolax depository's at that time.  He states that those gave him a little bit of relief but did not resolve his issue.  He has been able to eat and drink though he states he eats very little because it causes increased abdominal discomfort and nausea.  He has not had any vomiting.  He also denies fevers.  Patient does have a feculent odor to his breath.  Cardiopulmonary exam reveals S1-S2 heart sounds with regular rate and rhythm and lung sounds that are clear to auscultation all fields.  Abdomen is mildly distended and significantly tender diffusely.  His bowel sounds are active in all 4 quadrants.  We will check CBC, CMP, and abdominal 2 view for evaluation of constipation and/or small bowel obstruction.  CBC shows a normal white count of 8.0, H&H is 14 and 40.3, platelets are 319.  The differential is unremarkable.  2 view abdomen independently reviewed and evaluated by me.  Impression: Patient continues to have significant fecal loading.  There are scattered air-fluid levels present on both the AP and erect. Radiology overread is pending. Radiology impression states that the bowel gas pattern is normal and there is moderate colonic stool burden.  CMP shows mild hyponatremia with a sodium of 133, mild hypokalemia with a potassium of 3.3, and elevated creatinine of 1.34 with a GFR of 64.  Transaminases are normal.  BUN is 14.  Upon reviewing historical trends patient has had elevated creatinine in the past up to 1.362 years ago.  I  spoke with the patient and he states that he believes that the constipation was result of his long-term use of metformin.  He did quit the metformin and was placed on Farxiga.  Wilder Glade caused a full body rash so he stopped taking the [place] and is not currently taking any medication for his diabetes.  I will have patient follow-up this week or early next week  with his PCP Ruben Dales, NP at [facility] clinic regarding repeat lab work.  I will discharge him home and have him do a cleanout with a half a bottle of mag citrate and 64 ounces of fluid.  If he does not have progress with alleviation of stool burden and some distention I will have him redose.  If he has increasing pain, develops fever, or develops nausea and vomiting he should go to the ER.  Final Clinical Impressions(s) / UC Diagnoses   Final diagnoses:  Drug-induced constipation  Hypokalemia  Hyponatremia     Discharge Instructions      Your x-ray today shows that you still have a moderate stool burden throughout your colon.  Mix half a bottle of MiraLAX with 64 ounces of a fluid of your choice, such as Gatorade, and drink over a 4-hour period.  If you do not have resolution of your constipation in 12 hours take the other half a bottle of MiraLAX mixed in 64 ounces of fluid and redose.  Schedule an appointment with Ruben Foster over [place] [facility] for later this week or next week for reevaluation of your constipation.  Your blood work also showed that you had a slightly low sodium and slightly low potassium.  Your creatinine was also mildly elevated.  You will need to have your labs rechecked.  If you develop increased abdominal distention, abdominal pain, nausea and vomiting and cannot keep down fluids, or fail to have a bowel movement after using MiraLAX please go to the ER for evaluation.       ED Prescriptions   None    PDMP not reviewed this encounter.   Margarette Canada, NP 01/12/22 1000

## 2022-01-12 NOTE — Discharge Instructions (Addendum)
Your x-ray today shows that you still have a moderate stool burden throughout your colon.  Mix half a bottle of MiraLAX with 64 ounces of a fluid of your choice, such as Gatorade, and drink over a 4-hour period.  If you do not have resolution of your constipation in 12 hours take the other half a bottle of MiraLAX mixed in 64 ounces of fluid and redose.  Schedule an appointment with Ruben Foster over [place] [facility] for later this week or next week for reevaluation of your constipation.  Your blood work also showed that you had a slightly low sodium and slightly low potassium.  Your creatinine was also mildly elevated.  You will need to have your labs rechecked.  If you develop increased abdominal distention, abdominal pain, nausea and vomiting and cannot keep down fluids, or fail to have a bowel movement after using MiraLAX please go to the ER for evaluation.

## 2022-01-12 NOTE — ED Triage Notes (Signed)
Pt presents with constipation and bloating x 1 month. He has tried suppositories and miralax.

## 2022-02-03 ENCOUNTER — Encounter
Admission: RE | Disposition: A | Discharge status: Home / Self Care | Payer: Self-pay | Source: Home / Self Care | Attending: Gastroenterology

## 2022-02-03 ENCOUNTER — Encounter: Payer: Self-pay | Admitting: *Deleted

## 2022-02-03 ENCOUNTER — Other Ambulatory Visit: Payer: Self-pay

## 2022-02-03 ENCOUNTER — Ambulatory Visit
Admission: RE | Admit: 2022-02-03 | Discharge: 2022-02-03 | Disposition: A | Discharge status: Home / Self Care | Payer: Medicare Other | Attending: Gastroenterology | Admitting: Gastroenterology

## 2022-02-03 ENCOUNTER — Ambulatory Visit: Payer: Medicare Other | Admitting: Anesthesiology

## 2022-02-03 DIAGNOSIS — E119 Type 2 diabetes mellitus without complications: Secondary | ICD-10-CM | POA: Diagnosis not present

## 2022-02-03 DIAGNOSIS — R1013 Epigastric pain: Secondary | ICD-10-CM | POA: Diagnosis present

## 2022-02-03 DIAGNOSIS — K319 Disease of stomach and duodenum, unspecified: Secondary | ICD-10-CM | POA: Diagnosis not present

## 2022-02-03 DIAGNOSIS — I10 Essential (primary) hypertension: Secondary | ICD-10-CM | POA: Diagnosis not present

## 2022-02-03 DIAGNOSIS — Z7984 Long term (current) use of oral hypoglycemic drugs: Secondary | ICD-10-CM | POA: Diagnosis not present

## 2022-02-03 DIAGNOSIS — K922 Gastrointestinal hemorrhage, unspecified: Secondary | ICD-10-CM | POA: Insufficient documentation

## 2022-02-03 DIAGNOSIS — K219 Gastro-esophageal reflux disease without esophagitis: Secondary | ICD-10-CM | POA: Diagnosis not present

## 2022-02-03 HISTORY — PX: ESOPHAGOGASTRODUODENOSCOPY: SHX5428

## 2022-02-03 LAB — GLUCOSE, CAPILLARY: Glucose-Capillary: 127 mg/dL — ABNORMAL HIGH (ref 70–99)

## 2022-02-03 SURGERY — EGD (ESOPHAGOGASTRODUODENOSCOPY)
Anesthesia: General

## 2022-02-03 MED ORDER — PROPOFOL 10 MG/ML IV BOLUS
INTRAVENOUS | Status: DC | PRN
  Administered 2022-02-03: 40 mg via INTRAVENOUS
  Administered 2022-02-03: 10 mg via INTRAVENOUS

## 2022-02-03 MED ORDER — SODIUM CHLORIDE 0.9 % IV SOLN: INTRAVENOUS | Status: DC

## 2022-02-03 MED ORDER — PROPOFOL 500 MG/50ML IV EMUL
INTRAVENOUS | Status: DC | PRN
  Administered 2022-02-03: 200 ug/kg/min via INTRAVENOUS

## 2022-02-03 NOTE — Anesthesia Procedure Notes (Signed)
Date/Time: 02/03/2022 12:35 PM  Performed by: Nelda Marseille, CRNAPre-anesthesia Checklist: Patient identified, Emergency Drugs available, Suction available, Patient being monitored and Timeout performed Oxygen Delivery Method: Nasal cannula

## 2022-02-03 NOTE — Op Note (Signed)
[facility] Patient Name: Ruben Foster Procedure Date: 02/03/2022 12:33 PM MRN: [id number] Account #: [id number] Date of Birth: Oct 04, 1940 Admit Type: Outpatient Age: 81 Room: [facility] ENDO ROOM 3 Gender: Male Note Status: Finalized Instrument Name: Michaelle Birks [id number] Procedure:             Upper GI endoscopy Indications:           Epigastric abdominal pain Providers:             Andrey Farmer MD, MD Referring MD:          Thornton Dales Medicines:             Monitored Anesthesia Care Complications:         No immediate complications. Procedure:             Pre-Anesthesia Assessment:                        - Prior to the procedure, a History and Physical was                         performed, and patient medications and allergies were                         reviewed. The patient is competent. The risks and                         benefits of the procedure and the sedation options and                         risks were discussed with the patient. All questions                         were answered and informed consent was obtained.                         Patient identification and proposed procedure were                         verified by the physician, the nurse, the                         anesthesiologist, the anesthetist and the technician                         in the endoscopy suite. Mental Status Examination:                         alert and oriented. Airway Examination: normal                         oropharyngeal airway and neck mobility. Respiratory                         Examination: clear to auscultation. CV Examination:                         normal. Prophylactic Antibiotics: The patient does not  require prophylactic antibiotics. Prior                         Anticoagulants: The patient has taken no previous                         anticoagulant or antiplatelet agents. ASA Grade                          Assessment: II - A patient with mild systemic disease.                         After reviewing the risks and benefits, the patient                         was deemed in satisfactory condition to undergo the                         procedure. The anesthesia plan was to use monitored                         anesthesia care (MAC). Immediately prior to                         administration of medications, the patient was                         re-assessed for adequacy to receive sedatives. The                         heart rate, respiratory rate, oxygen saturations,                         blood pressure, adequacy of pulmonary ventilation, and                         response to care were monitored throughout the                         procedure. The physical status of the patient was                         re-assessed after the procedure.                        After obtaining informed consent, the endoscope was                         passed under direct vision. Throughout the procedure,                         the patient's blood pressure, pulse, and oxygen                         saturations were monitored continuously. The Endoscope                         was introduced through the mouth, and advanced to the  second part of duodenum. The upper GI endoscopy was                         somewhat difficult due to the patient's oxygen                         desaturation. Successful completion of the procedure                         was aided by managing the patient's medical                         instability. The patient tolerated the procedure well. Findings:      The examined esophagus was normal. Examination was brief due to oxygen       desaturation.      Multiple dispersed small erosions with stigmata of recent bleeding were       found in the gastric antrum. Biopsies not performed due to oxygen       desaturation but appeared to be secondary to  NSAIDS      The exam of the stomach was otherwise normal.      The examined duodenum was normal. Impression:            - Normal esophagus.                        - Erosive gastropathy with stigmata of recent bleeding.                        - Normal examined duodenum.                        - No specimens collected. Recommendation:        - Discharge patient to home.                        - Resume previous diet.                        - Continue present medications including PPI.                        - Perform an H. pylori stool antigen (HpSA) test at                         appointment to be scheduled.                        - No aspirin, ibuprofen, naproxen, or other                         non-steroidal anti-inflammatory drugs.                        - Return to referring physician as previously                         scheduled. Procedure Code(s):     --- Professional ---  86578, Esophagogastroduodenoscopy, flexible,                         transoral; diagnostic, including collection of                         specimen(s) by brushing or washing, when performed                         (separate procedure) Diagnosis Code(s):     --- Professional ---                        K92.2, Gastrointestinal hemorrhage, unspecified                        R10.13, Epigastric pain CPT copyright 2019 American Medical Association. All rights reserved. The codes documented in this report are preliminary and upon coder review may  be revised to meet current compliance requirements. Andrey Farmer MD, MD 02/03/2022 12:50:50 PM Number of Addenda: 0 Note Initiated On: 02/03/2022 12:33 PM Estimated Blood Loss:  Estimated blood loss: none.      [facility]

## 2022-02-03 NOTE — Transfer of Care (Signed)
Immediate Anesthesia Transfer of Care Note  Patient: Ruben Foster  Procedure(s) Performed: ESOPHAGOGASTRODUODENOSCOPY (EGD)  Patient Location: PACU  Anesthesia Type:General  Level of Consciousness: awake, alert  and oriented  Airway & Oxygen Therapy: Patient Spontanous Breathing  Post-op Assessment: Report given to RN and Post -op Vital signs reviewed and stable  Post vital signs: Reviewed and stable  Last Vitals:  Vitals Value Taken Time  BP 130/70 02/03/22 1249  Temp 36.1 C 02/03/22 1249  Pulse 79 02/03/22 1253  Resp 21 02/03/22 1253  SpO2 100 % 02/03/22 1253  Vitals shown include unvalidated device data.  Last Pain:  Vitals:   02/03/22 1249  TempSrc: Temporal  PainSc: 0-No pain         Complications: No notable events documented.

## 2022-02-03 NOTE — H&P (Signed)
Outpatient short stay form Pre-procedure 02/03/2022  [name], MD  Primary Physician: Latanya Maudlin, NP  Reason for visit:  Epigastric pain  History of present illness:    81 y/o gentleman with history of hypertension, DM II, and GERD here for EGD due to epigastric pain. No blood thinners. No significant neck surgeries.    Current Facility-Administered Medications:    0.9 %  sodium chloride infusion, , Intravenous, Continuous, Nigel Ericsson, Hilton Cork, MD, Last Rate: 20 mL/hr at 02/03/22 1229, Continued from Pre-op at 02/03/22 1229  Medications Prior to Admission  Medication Sig Dispense Refill Last Dose   allopurinol (ZYLOPRIM) 300 MG tablet Take 300 mg by mouth daily.    Past Week   cyanocobalamin 2000 MCG tablet Take 2,000 mcg by mouth daily.   02/02/2022   gabapentin (NEURONTIN) 300 MG capsule Take 300 mg by mouth at bedtime.   02/02/2022   glimepiride (AMARYL) 4 MG tablet Take 4 mg by mouth daily with breakfast.   1 02/02/2022   lisinopril-hydrochlorothiazide (PRINZIDE,ZESTORETIC) 20-25 MG tablet Take 1 tablet by mouth daily.  1 02/02/2022   metFORMIN (GLUCOPHAGE) 1000 MG tablet Take 1,000 mg by mouth 2 (two) times daily.  1 02/02/2022   pantoprazole (PROTONIX) 40 MG tablet Take 40 mg by mouth daily.    02/02/2022   potassium chloride SA (KLOR-CON M20) 20 MEQ tablet Take 20 mEq by mouth daily.    02/02/2022   hydrOXYzine (ATARAX/VISTARIL) 10 MG tablet Take 1 tablet (10 mg total) by mouth 3 (three) times daily as needed. 21 tablet 0    Misc Natural Products (URINOZINC PO) Take 1 tablet by mouth daily.        Allergies  Allergen Reactions   Chlorthalidone Other (See Comments)    Unknown   Strawberry Extract Other (See Comments)    Unknown     Past Medical History:  Diagnosis Date   Diabetes mellitus without complication (HCC)    GERD (gastroesophageal reflux disease)    Gout    Hypertension    Restless legs     Review of systems:  Otherwise negative.    Physical  Exam  Gen: Alert, oriented. Appears stated age.  HEENT: PERRLA. Lungs: No respiratory distress CV: RRR Abd: soft, benign, no masses Ext: No edema    Planned procedures: Proceed with EGD. The patient understands the nature of the planned procedure, indications, risks, alternatives and potential complications including but not limited to bleeding, infection, perforation, damage to internal organs and possible oversedation/side effects from anesthesia. The patient agrees and gives consent to proceed.  Please refer to procedure notes for findings, recommendations and patient disposition/instructions.     [name], MD [facility]

## 2022-02-03 NOTE — Anesthesia Preprocedure Evaluation (Signed)
Anesthesia Evaluation  Patient identified by MRN, date of birth, ID band Patient awake    Reviewed: Allergy & Precautions, NPO status , Patient's Chart, lab work & pertinent test results  History of Anesthesia Complications Negative for: history of anesthetic complications  Airway Mallampati: II  TM Distance: >3 FB Neck ROM: Full    Dental  (+) Poor Dentition, Missing, Dental Advidsory Given   Pulmonary neg shortness of breath, neg sleep apnea, neg COPD, neg recent URI, Current Smoker,    breath sounds clear to auscultation- rhonchi (-) wheezing      Cardiovascular hypertension, Pt. on medications (-) angina(-) CAD, (-) Past MI, (-) Cardiac Stents and (-) CABG (-) dysrhythmias (-) Valvular Problems/Murmurs Rhythm:Regular Rate:Normal - Systolic murmurs and - Diastolic murmurs    Neuro/Psych negative neurological ROS  negative psych ROS   GI/Hepatic Neg liver ROS, GERD  ,  Endo/Other  diabetes, Oral Hypoglycemic Agents  Renal/GU negative Renal ROS     Musculoskeletal negative musculoskeletal ROS (+)   Abdominal (+) - obese,   Peds  Hematology negative hematology ROS (+)   Anesthesia Other Findings Past Medical History: No date: Diabetes mellitus without complication (HCC) No date: GERD (gastroesophageal reflux disease) No date: Gout No date: Hypertension No date: Restless legs   Reproductive/Obstetrics                             Anesthesia Physical  Anesthesia Plan  ASA: 2  Anesthesia Plan: General   Post-op Pain Management:    Induction: Intravenous  PONV Risk Score and Plan: 1 and Propofol infusion and TIVA  Airway Management Planned: Natural Airway and Nasal Cannula  Additional Equipment:   Intra-op Plan:   Post-operative Plan:   Informed Consent: I have reviewed the patients History and Physical, chart, labs and discussed the procedure including the risks, benefits  and alternatives for the proposed anesthesia with the patient or authorized representative who has indicated his/her understanding and acceptance.     Dental advisory given  Plan Discussed with: CRNA and Anesthesiologist  Anesthesia Plan Comments:         Anesthesia Quick Evaluation

## 2022-02-03 NOTE — Interval H&P Note (Signed)
History and Physical Interval Note:  02/03/2022 12:31 PM  Ruben Foster  has presented today for surgery, with the diagnosis of epigastric pain  melena.  The various methods of treatment have been discussed with the patient and family. After consideration of risks, benefits and other options for treatment, the patient has consented to  Procedure(s): ESOPHAGOGASTRODUODENOSCOPY (EGD) (N/A) as a surgical intervention.  The patient's history has been reviewed, patient examined, no change in status, stable for surgery.  I have reviewed the patient's chart and labs.  Questions were answered to the patient's satisfaction.     [name]  Ok to proceed with EGD

## 2022-02-04 ENCOUNTER — Encounter: Payer: Self-pay | Admitting: Gastroenterology

## 2022-02-08 NOTE — Anesthesia Postprocedure Evaluation (Signed)
Anesthesia Post Note  Patient: Ruben Foster  Procedure(s) Performed: ESOPHAGOGASTRODUODENOSCOPY (EGD)  Patient location during evaluation: Endoscopy Anesthesia Type: General Level of consciousness: awake and alert Pain management: pain level controlled Vital Signs Assessment: post-procedure vital signs reviewed and stable Respiratory status: spontaneous breathing, nonlabored ventilation, respiratory function stable and patient connected to nasal cannula oxygen Cardiovascular status: blood pressure returned to baseline and stable Postop Assessment: no apparent nausea or vomiting Anesthetic complications: no   No notable events documented.   Last Vitals:  Vitals:   02/03/22 1259 02/03/22 1309  BP: (!) 146/84 137/80  Pulse: 76 72  Resp: 17 19  Temp:    SpO2: 100% 98%    Last Pain:  Vitals:   02/04/22 0724  TempSrc:   PainSc: 0-No pain                 Martha Clan

## 2023-02-01 ENCOUNTER — Ambulatory Visit
Admission: EM | Admit: 2023-02-01 | Discharge: 2023-02-01 | Disposition: A | Discharge status: Home / Self Care | Payer: Medicare Other | Attending: Family Medicine | Admitting: Family Medicine

## 2023-02-01 DIAGNOSIS — B028 Zoster with other complications: Secondary | ICD-10-CM | POA: Diagnosis not present

## 2023-02-01 MED ORDER — VALACYCLOVIR HCL 1 G PO TABS: 1000.0000 mg | ORAL_TABLET | Freq: Three times a day (TID) | ORAL | 0 refills | Status: AC

## 2023-02-01 MED ORDER — GABAPENTIN 300 MG PO CAPS: 300.0000 mg | ORAL_CAPSULE | Freq: Every day | ORAL | 0 refills | Status: DC

## 2023-02-01 MED ORDER — TRIAMCINOLONE ACETONIDE 0.1 % EX OINT: 1.0000 | TOPICAL_OINTMENT | Freq: Two times a day (BID) | CUTANEOUS | 0 refills | Status: AC

## 2023-02-01 NOTE — ED Triage Notes (Signed)
Pt c/o L shoulder pain x4 days. Denies any falls or injuries. Has tried tylenol w/o relief.

## 2023-02-01 NOTE — ED Provider Notes (Signed)
[facility]    CSN: [id number] Arrival date & time: 02/01/23  1204      History   Chief Complaint Chief Complaint  Patient presents with   Shoulder Pain    HPI  HPI Ruben Foster is a 82 y.o. male.   Ruben Foster presents for left posterior shoulder pain that is getting worse that started Friday.   Took some anti-inflammatory medication with short-term relief.  Has aching and burning pain. Has soreness in his left breast but this went away.  No falls or trauma. He has been swinging golf clubs on Friday morning. He was startled by a "huge spider web" and jumped back. Denies injury during this jolt and had no trouble swinging his golf clubs. No chest pain, nausea, diarrhea, shortness of breath.     Past Medical History:  Diagnosis Date   Diabetes mellitus without complication (HCC)    GERD (gastroesophageal reflux disease)    Gout    Hypertension    Restless legs     Patient Active Problem List   Diagnosis Date Noted   Diabetes mellitus, type 2 (HCC) 02/23/2018   Diabetic neuropathy (HCC) 02/23/2018   Hypertriglyceridemia 02/23/2018   Gastroenteritis 10/21/2017   Vitamin B 12 deficiency 02/27/2017   Restless leg syndrome, controlled 02/13/2017   GERD (gastroesophageal reflux disease) 11/04/2016   ED (erectile dysfunction) 10/04/2013   Gout 10/04/2013   HTN (hypertension) 10/04/2013    Past Surgical History:  Procedure Laterality Date   APPENDECTOMY     CHOLECYSTECTOMY     CIRCUMCISION N/A 03/27/2018   Procedure: CIRCUMCISION ADULT;  Surgeon: Riki Altes, MD;  Location: [facility] ORS;  Service: Urology;  Laterality: N/A;  MAC & Local anesthesia   ESOPHAGOGASTRODUODENOSCOPY N/A 02/03/2022   Procedure: ESOPHAGOGASTRODUODENOSCOPY (EGD);  Surgeon: Regis Bill, MD;  Location: [facility];  Service: Endoscopy;  Laterality: N/A;   rectal fissure         Home Medications    Prior to Admission medications   Medication Sig Start Date End Date Taking?  Authorizing Provider  allopurinol (ZYLOPRIM) 300 MG tablet Take 300 mg by mouth daily.  08/04/17  Yes [provider]  atorvastatin (LIPITOR) 10 MG tablet Take 5 mg by mouth daily.   Yes [provider]  cyanocobalamin 2000 MCG tablet Take 2,000 mcg by mouth daily.   Yes [provider]  fenofibrate (TRICOR) 48 MG tablet Take by mouth. 10/06/22 10/06/23 Yes [provider]  glimepiride (AMARYL) 4 MG tablet Take 4 mg by mouth daily with breakfast.  09/15/17  Yes [provider]  hydrOXYzine (ATARAX/VISTARIL) 10 MG tablet Take 1 tablet (10 mg total) by mouth 3 (three) times daily as needed. 04/25/19  Yes Tommi Rumps, PA-C  LINZESS 145 MCG CAPS capsule Take 145 mcg by mouth daily.   Yes [provider]  lisinopril-hydrochlorothiazide (PRINZIDE,ZESTORETIC) 20-25 MG tablet Take 1 tablet by mouth daily. 08/30/17  Yes [provider]  metFORMIN (GLUCOPHAGE) 1000 MG tablet Take 1,000 mg by mouth 2 (two) times daily. 09/22/17  Yes [provider]  Misc Natural Products (URINOZINC PO) Take 1 tablet by mouth daily.   Yes [provider]  pantoprazole (PROTONIX) 40 MG tablet Take 40 mg by mouth daily.    Yes [provider]  pioglitazone (ACTOS) 30 MG tablet Take 1 tablet by mouth daily. 12/30/22  Yes [provider]  potassium chloride SA (KLOR-CON M20) 20 MEQ tablet Take 20 mEq by mouth daily.  05/11/17  Yes [provider]  triamcinolone ointment (KENALOG) 0.1 % Apply 1 Application topically 2 (two) times daily. 02/01/23  Yes Lakiyah Arntson, DO  valACYclovir (VALTREX) 1000 MG tablet Take 1 tablet (1,000 mg total) by mouth 3 (three) times daily for 7 days. 02/01/23 02/08/23 Yes Brynja Marker, DO  gabapentin (NEURONTIN) 300 MG capsule Take 1 capsule (300 mg total) by mouth at bedtime. 02/01/23   Katha Cabal, DO    Family History Family History  Problem Relation Age of Onset   Emphysema Father      Social History Social History   Tobacco Use   Smoking status: Some Days    Types: Pipe   Smokeless tobacco: Never  Vaping Use   Vaping status: Never Used  Substance Use Topics   Alcohol use: Not Currently   Drug use: Not Currently     Allergies   Chlorthalidone and Strawberry extract   Review of Systems Review of Systems: :negative unless otherwise stated in HPI.      Physical Exam Triage Vital Signs ED Triage Vitals  Encounter Vitals Group     BP 02/01/23 1321 (!) 142/95     Systolic BP Percentile --      Diastolic BP Percentile --      Pulse Rate 02/01/23 1321 64     Resp 02/01/23 1321 16     Temp 02/01/23 1321 97.8 F (36.6 C)     Temp Source 02/01/23 1321 Oral     SpO2 02/01/23 1321 100 %     Weight 02/01/23 1321 204 lb (92.5 kg)     Height 02/01/23 1321 6\' 1"  (1.854 m)     Head Circumference --      Peak Flow --      Pain Score 02/01/23 1324 2     Pain Loc --      Pain Education --      Exclude from Growth Chart --    No data found.  Updated Vital Signs BP (!) 142/95 (BP Location: Left Arm)   Pulse 64   Temp 97.8 F (36.6 C) (Oral)   Resp 16   Ht 6\' 1"  (1.854 m)   Wt 92.5 kg   SpO2 100%   BMI 26.91 kg/m   Visual Acuity Right Eye Distance:   Left Eye Distance:   Bilateral Distance:    Right Eye Near:   Left Eye Near:    Bilateral Near:     Physical Exam GEN: well appearing male in no acute distress  CVS: well perfused  RESP: speaking in full sentences without pause, no respiratory distress  MSK: Left  shoulder:  No evidence of bony deformity, asymmetry, or muscle atrophy. No tenderness over long head of biceps (bicipital groove).  No TTP at [facility] joint.  Good active and passive (ABD, ADD, Flexion, extension, IR, ER).  Strength 5/5 grip, elbow and shoulder. No abnormal scapular function observed.  Special Tests: Deferred due to skin findings Sensation intact. Peripheral pulses intact. SKIN : Blisterlike erythematous patches on  posterior back wrapping around to the left flank, rash does not cross midline    UC Treatments / Results  Labs (all labs ordered are listed, but only abnormal results are displayed) Labs Reviewed - No data to display  EKG   Radiology No results found.   Procedures Procedures (including critical care time)  Medications Ordered in UC Medications - No data to display  Initial Impression / Assessment and Plan / UC Course  I have reviewed  the triage vital signs and the nursing notes.  Pertinent labs & imaging results that were available during my care of the patient were reviewed by me and considered in my medical decision making (see chart for details).      Pt is a 82 y.o.  male with left posterior shoulder pain for the past few days.  Imaging deferred as he has evidence of skin rash posteriorly where he is having burning pain.  Exam concerning for shingles.  He has not had shingles vaccine but has had shingles before.  Treat with valacyclovir 3 times daily for 7 days.  Given gabapentin for pain relief.  Trial \\triamcinolone  ointment for itching.  Patient to follow up with primary care provider, if symptoms do not improve with conservative treatment.  Return and ED precautions given. Understanding voiced. Discussed MDM, treatment plan and plan for follow-up with patient who agrees with plan.   Final Clinical Impressions(s) / UC Diagnoses   Final diagnoses:  Herpes zoster with other complication     Discharge Instructions      You have evidence of Shingles. Stop by the pharmacy to pick up your prescriptions.  Follow up with your primary care provider as needed.'     ED Prescriptions     Medication Sig Dispense Auth. Provider   valACYclovir (VALTREX) 1000 MG tablet Take 1 tablet (1,000 mg total) by mouth 3 (three) times daily for 7 days. 21 tablet Larenz Frasier, DO   gabapentin (NEURONTIN) 300 MG capsule Take 1 capsule (300 mg total) by mouth at bedtime. 30 capsule  Tyson Parkison, DO   triamcinolone ointment (KENALOG) 0.1 % Apply 1 Application topically 2 (two) times daily. 30 g Katha Cabal, DO      PDMP not reviewed this encounter.   Katha Cabal, DO 02/04/23 1313

## 2023-02-01 NOTE — Discharge Instructions (Addendum)
You have evidence of Shingles. Stop by the pharmacy to pick up your prescriptions.  Follow up with your primary care provider as needed.'

## 2023-07-03 ENCOUNTER — Ambulatory Visit (INDEPENDENT_AMBULATORY_CARE_PROVIDER_SITE_OTHER): Payer: Medicare Other

## 2023-07-03 ENCOUNTER — Encounter: Payer: Self-pay | Admitting: Emergency Medicine

## 2023-07-03 ENCOUNTER — Ambulatory Visit
Admission: EM | Admit: 2023-07-03 | Discharge: 2023-07-03 | Disposition: A | Discharge status: Home / Self Care | Payer: Medicare Other | Attending: Family Medicine | Admitting: Family Medicine

## 2023-07-03 DIAGNOSIS — R0781 Pleurodynia: Secondary | ICD-10-CM

## 2023-07-03 MED ORDER — HYDROCODONE-ACETAMINOPHEN 5-325 MG PO TABS: 2.0000 | ORAL_TABLET | ORAL | 0 refills | Status: AC | PRN

## 2023-07-03 MED ORDER — METHOCARBAMOL 500 MG PO TABS: 500.0000 mg | ORAL_TABLET | Freq: Two times a day (BID) | ORAL | 0 refills | Status: AC

## 2023-07-03 NOTE — ED Provider Notes (Signed)
[facility]    CSN: [id number] Arrival date & time: 07/03/23  8469      History   Chief Complaint Chief Complaint  Patient presents with   rib cage pain     HPI  HPI Ruben Foster is a 83 y.o. male.   Ruben Foster presents for left sided rib pain for the past couple weeks he was empting out his wife's junk room and some of the stuff was pretty heavy.  They took the items to the landfill. The next night he was having lower left rib pain. Thought he strained something but its not getting better. Has dull aching pain. Took some Tylenol without relief. No coughing or shortness of breath. Says the area felt hot at one point.  No fever. Does not hurt to breathe but taking off his shirt causes some discomfort.     Past Medical History:  Diagnosis Date   Diabetes mellitus without complication (HCC)    GERD (gastroesophageal reflux disease)    Gout    Hypertension    Restless legs     Patient Active Problem List   Diagnosis Date Noted   Diabetes mellitus, type 2 (HCC) 02/23/2018   Diabetic neuropathy (HCC) 02/23/2018   Hypertriglyceridemia 02/23/2018   Gastroenteritis 10/21/2017   Vitamin B 12 deficiency 02/27/2017   Restless leg syndrome, controlled 02/13/2017   GERD (gastroesophageal reflux disease) 11/04/2016   ED (erectile dysfunction) 10/04/2013   Gout 10/04/2013   HTN (hypertension) 10/04/2013    Past Surgical History:  Procedure Laterality Date   APPENDECTOMY     CHOLECYSTECTOMY     CIRCUMCISION N/A 03/27/2018   Procedure: CIRCUMCISION ADULT;  Surgeon: Riki Altes, MD;  Location: [facility] ORS;  Service: Urology;  Laterality: N/A;  MAC & Local anesthesia   ESOPHAGOGASTRODUODENOSCOPY N/A 02/03/2022   Procedure: ESOPHAGOGASTRODUODENOSCOPY (EGD);  Surgeon: Regis Bill, MD;  Location: [facility] ENDOSCOPY;  Service: Endoscopy;  Laterality: N/A;   rectal fissure         Home Medications    Prior to Admission medications   Medication Sig Start Date End  Date Taking? Authorizing Provider  HYDROcodone-acetaminophen (NORCO/VICODIN) 5-325 MG tablet Take 2 tablets by mouth every 4 (four) hours as needed. 07/03/23  Yes Carletta Feasel, DO  methocarbamol (ROBAXIN) 500 MG tablet Take 1 tablet (500 mg total) by mouth 2 (two) times daily. 07/03/23  Yes Brentyn Seehafer, DO  allopurinol (ZYLOPRIM) 300 MG tablet Take 300 mg by mouth daily.  08/04/17   [provider]  atorvastatin (LIPITOR) 10 MG tablet Take 5 mg by mouth daily.    [provider]  cyanocobalamin 2000 MCG tablet Take 2,000 mcg by mouth daily.    [provider]  fenofibrate (TRICOR) 48 MG tablet Take by mouth. 10/06/22 10/06/23  [provider]  gabapentin (NEURONTIN) 300 MG capsule Take 1 capsule (300 mg total) by mouth at bedtime. 02/01/23   Makensey Rego, Seward Meth, DO  glimepiride (AMARYL) 4 MG tablet Take 4 mg by mouth daily with breakfast.  09/15/17   [provider]  hydrOXYzine (ATARAX/VISTARIL) 10 MG tablet Take 1 tablet (10 mg total) by mouth 3 (three) times daily as needed. 04/25/19   Tommi Rumps, PA-C  LINZESS 145 MCG CAPS capsule Take 145 mcg by mouth daily.    [provider]  lisinopril-hydrochlorothiazide (PRINZIDE,ZESTORETIC) 20-25 MG tablet Take 1 tablet by mouth daily. 08/30/17   [provider]  metFORMIN (GLUCOPHAGE) 1000 MG tablet Take 1,000 mg by mouth 2 (two)  times daily. 09/22/17   [provider]  Misc Natural Products (URINOZINC PO) Take 1 tablet by mouth daily.    [provider]  pantoprazole (PROTONIX) 40 MG tablet Take 40 mg by mouth daily.     [provider]  pioglitazone (ACTOS) 30 MG tablet Take 1 tablet by mouth daily. 12/30/22   [provider]  potassium chloride SA (KLOR-CON M20) 20 MEQ tablet Take 20 mEq by mouth daily.  05/11/17   [provider]  triamcinolone ointment (KENALOG) 0.1 % Apply 1 Application topically 2 (two) times daily. 02/01/23   Katha Cabal, DO     Family History Family History  Problem Relation Age of Onset   Emphysema Father     Social History Social History   Tobacco Use   Smoking status: Some Days    Types: Pipe   Smokeless tobacco: Never  Vaping Use   Vaping status: Never Used  Substance Use Topics   Alcohol use: Not Currently   Drug use: Not Currently     Allergies   Chlorthalidone and Strawberry extract   Review of Systems Review of Systems: :negative unless otherwise stated in HPI.      Physical Exam Triage Vital Signs ED Triage Vitals  Encounter Vitals Group     BP 07/03/23 1151 133/79     Systolic BP Percentile --      Diastolic BP Percentile --      Pulse Rate 07/03/23 1151 78     Resp 07/03/23 1151 18     Temp 07/03/23 1151 98 F (36.7 C)     Temp Source 07/03/23 1151 Oral     SpO2 07/03/23 1151 97 %     Weight --      Height --      Head Circumference --      Peak Flow --      Pain Score 07/03/23 1149 4     Pain Loc --      Pain Education --      Exclude from Growth Chart --    No data found.  Updated Vital Signs BP 133/79 (BP Location: Right Arm)   Pulse 78   Temp 98 F (36.7 C) (Oral)   Resp 18   SpO2 97%   Visual Acuity Right Eye Distance:   Left Eye Distance:   Bilateral Distance:    Right Eye Near:   Left Eye Near:    Bilateral Near:     Physical Exam GEN: well appearing male in no acute distress  CVS: well perfused, regular rate and rhythm CHEST WALL:  no step offs or deformities, no ecchymosis or visible hematomas, lower left ribs are TTP RESP: speaking in full sentences without pause, no respiratory distress  MSK: no midline thoracic pain or paraspinal muscle tenderness, pain increased while raising his arms.  Good ROM of left shoulder  Sensation intact. Peripheral pulses intact.   UC Treatments / Results  Labs (all labs ordered are listed, but only abnormal results are displayed) Labs Reviewed - No data to display  EKG   Radiology DG Ribs  Unilateral W/Chest Left Result Date: 07/03/2023 CLINICAL DATA:  Left rib pain for 2 weeks.  Lifting injury. EXAM: LEFT RIBS AND CHEST - 3+ VIEW COMPARISON:  Chest radiographs 09/15/2020 and 06/18/2019. FINDINGS: The heart size and mediastinal contours are stable. There is mild aortic atherosclerosis. The lungs are clear. No pleural effusion or pneumothorax. No acute left-sided rib fractures are identified. There are mild  degenerative changes in the thoracolumbar spine. Cholecystectomy clips noted. IMPRESSION: 1. No evidence of acute left-sided rib fracture or pneumothorax. 2. No acute cardiopulmonary process. Electronically Signed   By: Carey Bullocks M.D.   On: 07/03/2023 14:34     Procedures Procedures (including critical care time)  Medications Ordered in UC Medications - No data to display  Initial Impression / Assessment and Plan / UC Course  I have reviewed the triage vital signs and the nursing notes.  Pertinent labs & imaging results that were available during my care of the patient were reviewed by me and considered in my medical decision making (see chart for details).      Pt is a 83 y.o.  male with 2 weeks of left lower rib pain that started after lifting heavy objects at home.  On exam, pt has tenderness at left lower ribs concerning for possible rib fracture.   Obtained chest and left rib plain films.  Personally interpreted by me were unremarkable for fracture or dislocated ribs. No pleural effusion or pneumothorax. No focal pneumonia seen.  .[id number]  Patient to gradually return to normal activities, as tolerated and continue ordinary activities within the limits permitted by pain. Prescribed short course of Norco and muscle relaxer  for pain relief.  Lidocaine patches and up to 2000 mg Tylenol PRN.    Patient to follow up with primary care provider, if symptoms do not improve with conservative treatment.  Return and ED precautions given. Understanding voiced. Discussed MDM,  treatment plan and plan for follow-up with patient who agrees with plan.   Radiologist impression reviewed. No change in plan warranted.   Final Clinical Impressions(s) / UC Diagnoses   Final diagnoses:  Rib pain on left side     Discharge Instructions      On my review of your xray images, you did not have any fractures or dislocated ribs. The radiologist has not yet read your xray. If it is significantly abnormal or urgent, someone will contact you.  You should see your results in MyChart.   I sent a stronger pain medication and a muscle relaxer to your pharmacy, stop by the pharmacy to pick it up.  Do not drive or operate heavy machinery while taking Norco/Vicodin for your muscle relaxer methocarbamol/Robaxin.      ED Prescriptions     Medication Sig Dispense Auth. Provider   HYDROcodone-acetaminophen (NORCO/VICODIN) 5-325 MG tablet Take 2 tablets by mouth every 4 (four) hours as needed. 12 tablet Tamicka Shimon, DO   methocarbamol (ROBAXIN) 500 MG tablet Take 1 tablet (500 mg total) by mouth 2 (two) times daily. 20 tablet Tierra Thoma, Seward Meth, DO      I have reviewed the PDMP during this encounter.   Katha Cabal, DO 07/03/23 1554

## 2023-07-03 NOTE — ED Triage Notes (Signed)
Pt c/o left side rib cage pain x 2 weeks. Pt states he was cleaning out a spare bedroom and lifting heavy items.

## 2023-07-03 NOTE — Discharge Instructions (Addendum)
On my review of your xray images, you did not have any fractures or dislocated ribs. The radiologist has not yet read your xray. If it is significantly abnormal or urgent, someone will contact you.  You should see your results in MyChart.   I sent a stronger pain medication and a muscle relaxer to your pharmacy, stop by the pharmacy to pick it up.  Do not drive or operate heavy machinery while taking Norco/Vicodin for your muscle relaxer methocarbamol/Robaxin.

## 2023-08-14 ENCOUNTER — Other Ambulatory Visit: Payer: Self-pay | Admitting: Internal Medicine

## 2023-08-14 DIAGNOSIS — R079 Chest pain, unspecified: Secondary | ICD-10-CM

## 2024-03-29 ENCOUNTER — Ambulatory Visit
Admission: EM | Admit: 2024-03-29 | Discharge: 2024-03-29 | Disposition: A | Discharge status: Home / Self Care | Attending: Emergency Medicine | Admitting: Emergency Medicine

## 2024-03-29 ENCOUNTER — Encounter: Payer: Self-pay | Admitting: Emergency Medicine

## 2024-03-29 DIAGNOSIS — B029 Zoster without complications: Secondary | ICD-10-CM

## 2024-03-29 MED ORDER — VALACYCLOVIR HCL 1 G PO TABS
1000.0000 mg | ORAL_TABLET | Freq: Three times a day (TID) | ORAL | 0 refills | Status: AC
Start: 2024-03-29 — End: ?

## 2024-03-29 MED ORDER — GABAPENTIN 300 MG PO CAPS: 300.0000 mg | ORAL_CAPSULE | Freq: Every day | ORAL | 0 refills | Status: AC

## 2024-03-29 NOTE — ED Provider Notes (Signed)
 [facility]    CSN: [id number] Arrival date & time: 03/29/24  0807      History   Chief Complaint Chief Complaint  Patient presents with   Abdominal Pain    HPI Ruben Foster is a 83 y.o. male.   HPI  83 year old male with past medical history significant for restless leg syndrome, hypertension, gout, GERD, diabetes presents for evaluation of left-sided rib and abdominal pain.  He reports at baseline he has tenderness in the left side of his ribs but this is different.  The pain became more intense last night and he describes it as a stinging burning sensation which feels similar to when he had shingles in the past.  He denies any rash.  No associated GI symptoms or fever.  Past Medical History:  Diagnosis Date   Diabetes mellitus without complication (HCC)    GERD (gastroesophageal reflux disease)    Gout    Hypertension    Restless legs     Patient Active Problem List   Diagnosis Date Noted   Diabetes mellitus, type 2 (HCC) 02/23/2018   Diabetic neuropathy (HCC) 02/23/2018   Hypertriglyceridemia 02/23/2018   Gastroenteritis 10/21/2017   Vitamin B 12 deficiency 02/27/2017   Restless leg syndrome, controlled 02/13/2017   GERD (gastroesophageal reflux disease) 11/04/2016   ED (erectile dysfunction) 10/04/2013   Gout 10/04/2013   HTN (hypertension) 10/04/2013    Past Surgical History:  Procedure Laterality Date   APPENDECTOMY     CHOLECYSTECTOMY     CIRCUMCISION N/A 03/27/2018   Procedure: CIRCUMCISION ADULT;  Surgeon: Twylla Glendia BROCKS, MD;  Location: [facility] ORS;  Service: Urology;  Laterality: N/A;  MAC & Local anesthesia   ESOPHAGOGASTRODUODENOSCOPY N/A 02/03/2022   Procedure: ESOPHAGOGASTRODUODENOSCOPY (EGD);  Surgeon: Maryruth Ole DASEN, MD;  Location: [facility] ENDOSCOPY;  Service: Endoscopy;  Laterality: N/A;   rectal fissure         Home Medications    Prior to Admission medications   Medication Sig Start Date End Date Taking? Authorizing  Provider  valACYclovir  (VALTREX ) 1000 MG tablet Take 1 tablet (1,000 mg total) by mouth 3 (three) times daily. 03/29/24  Yes Bernardino Ditch, NP  allopurinol  (ZYLOPRIM ) 300 MG tablet Take 300 mg by mouth daily.  08/04/17   [provider]  atorvastatin (LIPITOR) 10 MG tablet Take 5 mg by mouth daily.    [provider]  cyanocobalamin 2000 MCG tablet Take 2,000 mcg by mouth daily.    [provider]  gabapentin  (NEURONTIN ) 300 MG capsule Take 1 capsule (300 mg total) by mouth at bedtime. 03/29/24   Bernardino Ditch, NP  glimepiride  (AMARYL ) 4 MG tablet Take 4 mg by mouth daily with breakfast.  09/15/17   [provider]  HYDROcodone -acetaminophen  (NORCO/VICODIN) 5-325 MG tablet Take 2 tablets by mouth every 4 (four) hours as needed. 07/03/23   Brimage, Vondra, DO  hydrOXYzine  (ATARAX /VISTARIL ) 10 MG tablet Take 1 tablet (10 mg total) by mouth 3 (three) times daily as needed. 04/25/19   Saunders Shona CROME, PA-C  LINZESS 145 MCG CAPS capsule Take 145 mcg by mouth daily.    [provider]  lisinopril -hydrochlorothiazide (PRINZIDE,ZESTORETIC) 20-25 MG tablet Take 1 tablet by mouth daily. 08/30/17   [provider]  metFORMIN (GLUCOPHAGE) 1000 MG tablet Take 1,000 mg by mouth 2 (two) times daily. 09/22/17   [provider]  methocarbamol  (ROBAXIN ) 500 MG tablet Take 1 tablet (500 mg total) by mouth 2 (two) times daily. 07/03/23   Brimage, Vondra,  DO  Misc Natural Products (URINOZINC PO) Take 1 tablet by mouth daily.    [provider]  pantoprazole (PROTONIX) 40 MG tablet Take 40 mg by mouth daily.     [provider]  pioglitazone (ACTOS) 30 MG tablet Take 1 tablet by mouth daily. 12/30/22   [provider]  potassium chloride  SA (KLOR-CON  M20) 20 MEQ tablet Take 20 mEq by mouth daily.  05/11/17   [provider]  triamcinolone  ointment (KENALOG ) 0.1 % Apply 1 Application topically 2 (two) times daily. 02/01/23   Brimage,  Vondra, DO    Family History Family History  Problem Relation Age of Onset   Emphysema Father     Social History Social History   Tobacco Use   Smoking status: Some Days    Types: Pipe   Smokeless tobacco: Never  Vaping Use   Vaping status: Never Used  Substance Use Topics   Alcohol use: Not Currently   Drug use: Not Currently     Allergies   Chlorthalidone and Strawberry extract   Review of Systems Review of Systems  Constitutional:  Negative for fever.  Gastrointestinal:  Positive for abdominal pain.  Skin:  Negative for rash.     Physical Exam Triage Vital Signs ED Triage Vitals  Encounter Vitals Group     BP      Girls Systolic BP Percentile      Girls Diastolic BP Percentile      Boys Systolic BP Percentile      Boys Diastolic BP Percentile      Pulse      Resp      Temp      Temp src      SpO2      Weight      Height      Head Circumference      Peak Flow      Pain Score      Pain Loc      Pain Education      Exclude from Growth Chart    No data found.  Updated Vital Signs BP (!) 162/84 (BP Location: Right Arm)   Pulse 76   Temp 97.7 F (36.5 C) (Oral)   Resp 16   Ht 6' 1 (1.854 m)   Wt 203 lb 14.8 oz (92.5 kg)   SpO2 96%   BMI 26.90 kg/m   Visual Acuity Right Eye Distance:   Left Eye Distance:   Bilateral Distance:    Right Eye Near:   Left Eye Near:    Bilateral Near:     Physical Exam Vitals and nursing note reviewed.  Constitutional:      Appearance: Normal appearance. He is not ill-appearing.  HENT:     Head: Normocephalic and atraumatic.  Skin:    Capillary Refill: Capillary refill takes less than 2 seconds.     Findings: Erythema and rash present.  Neurological:     General: No focal deficit present.     Mental Status: He is alert and oriented to person, place, and time.      UC Treatments / Results  Labs (all labs ordered are listed, but only abnormal results are displayed) Labs Reviewed - No data to  display  EKG   Radiology No results found.  Procedures Procedures (including critical care time)  Medications Ordered in UC Medications - No data to display  Initial Impression / Assessment and Plan / UC Course  I have reviewed the triage vital signs  and the nursing notes.  Pertinent labs & imaging results that were available during my care of the patient were reviewed by me and considered in my medical decision making (see chart for details).   Patient is a pleasant, nontoxic-appearing 83 year old male presenting for evaluation of tingling/burning sensation on his left lower ribs and upper abdomen that started last night.  He reports it feels similar to when he had shingles in the past.  He reports he has not noticed a rash.  Does follow along a single dermatome line anterior and posterior, and the skin is mildly tender to palpation.   As you can see in the image above, there is 2 erythematous patches starting to form but no vesicular lesions at present.  I will discharge patient home with diagnosis of presumptive shingles and start him on Valtrex  1000 mg 3 times daily x 7 days.  His med list currently lists gabapentin  for nerve pain at bedtime and I will have him continue this. Final Clinical Impressions(s) / UC Diagnoses   Final diagnoses:  Herpes zoster without complication     Discharge Instructions      Valtrex  1000 mg 3 times a day for 7 days for treatment of shingles.  You may use cool compresses on the affected area to help provide comfort.  You may also apply aluminum acetate (found in most anti-itch cream ) lotion to the area to help ease the itching, or use topical Benadryl cream.  Use over-the-counter Tylenol  and or ibuprofen as needed for pain.  Take the gabapentin  300 mg at bedtime to help with nerve inflammation and pain.  If you continue to have pain after the rash resolves follow-up with your primary care provider for medication options.  Keep the area  covered until after the blisters have ruptured and dried up.  Once they have dried up you no longer need to keep a dressing in place.  Avoid contact with unvaccinated children, pregnant women, or those who are immunocompromised or who are taking immune suppressive medication such as Humira, prednisone , methotrexate, or chemotherapy.      ED Prescriptions     Medication Sig Dispense Auth. Provider   gabapentin  (NEURONTIN ) 300 MG capsule Take 1 capsule (300 mg total) by mouth at bedtime. 30 capsule Bernardino Ditch, NP   valACYclovir  (VALTREX ) 1000 MG tablet Take 1 tablet (1,000 mg total) by mouth 3 (three) times daily. 21 tablet Bernardino Ditch, NP      PDMP not reviewed this encounter.   Bernardino Ditch, NP 03/29/24 [phone]

## 2024-03-29 NOTE — ED Triage Notes (Signed)
 Patient reports left sided rib and abdominal pain that started 2 days ago.  Patient reports tingling and burning sensation.  Patient denies rash.  Patient reports history of Shingles. Patient denies fevers.

## 2024-03-29 NOTE — Discharge Instructions (Addendum)
 Valtrex  1000 mg 3 times a day for 7 days for treatment of shingles.  You may use cool compresses on the affected area to help provide comfort.  You may also apply aluminum acetate (found in most anti-itch cream ) lotion to the area to help ease the itching, or use topical Benadryl cream.  Use over-the-counter Tylenol  and or ibuprofen as needed for pain.  Take the gabapentin  300 mg at bedtime to help with nerve inflammation and pain.  If you continue to have pain after the rash resolves follow-up with your primary care provider for medication options.  Keep the area covered until after the blisters have ruptured and dried up.  Once they have dried up you no longer need to keep a dressing in place.  Avoid contact with unvaccinated children, pregnant women, or those who are immunocompromised or who are taking immune suppressive medication such as Humira, prednisone , methotrexate, or chemotherapy.

## 2024-04-21 ENCOUNTER — Ambulatory Visit
Admission: EM | Admit: 2024-04-21 | Discharge: 2024-04-21 | Disposition: A | Discharge status: Home / Self Care | Attending: Emergency Medicine | Admitting: Emergency Medicine

## 2024-04-21 DIAGNOSIS — R10A2 Flank pain, left side: Secondary | ICD-10-CM

## 2024-04-21 NOTE — Discharge Instructions (Signed)
 This could be shingles, but I think it would be very unusual.  You have left flank tenderness, but no appreciable rash.  I am concerned that there is something going on your abdomen.  It could be diverticulitis, colitis, kidney stone, perforation, obstruction.  I am sending you to the emergency department for comprehensive evaluation and further management, and for pain control if necessary.  Not have anything to eat or drink until your ER evaluation is complete.  Let them know if the pain changes or gets worse.

## 2024-04-21 NOTE — ED Triage Notes (Signed)
 Pt is with his son  Pt c/o possible shingles flare up  Pt states that he was here 3 weeks ago and was told he had shingles and was given medication  Pt states that he has taken the medication but the shingles had reappeared across his injured ribs on the left flank, navel, shoulders, and back

## 2024-04-21 NOTE — ED Provider Notes (Signed)
 HPI  SUBJECTIVE:  Ruben Foster is a 83 y.o. male who presents with sharp, stinging, intermittent left flank pain going around to his back starting yesterday.  He states is similar to the shingles pain that he had 3 weeks ago, but it is in a completely different location.  He states that the pain occurs in the left flank and in the back at the same time.  He reports that the left quadrant feels swollen and hot.  No nausea, diarrhea, fevers, rash in the area of pain.  No itching, burning paresthesias.  No trauma to the abdomen or back, recent heavy lifting, change in activity.  He had breakfast this morning.  No urinary complaints.  Had a normal bowel movement this morning.  Son notes that the car ride over here was painful for the patient.  Patient is using the lidocaine  patch on his upper ribs, taking gabapentin  3 times daily, and took a warm shower this morning.  The warm water and leaning to the right helps the pain.  Symptoms are worse with sitting straight up and with certain movements.  He has a past medical history of chronic rib pain since April, shingles, diabetes, hypertension, gout, hypercholesterolemia, he is status post appendectomy and cholecystectomy.  No history of chronic kidney disease.  He was seen here on 10/31 for left-sided rib and abdominal pain, he was found to have zoster and sent home with Valtrex  3 times daily for 7 days, and was advised to continue gabapentin  nightly.  He followed-up with his PCP on 11/10 for postherpetic neuralgia, was prescribed capsaicin topical cream, Lidoderm  patch and Neurontin  was increased to 300 3 times daily.  [facility].  Past Medical History:  Diagnosis Date   Diabetes mellitus without complication (HCC)    GERD (gastroesophageal reflux disease)    Gout    Hypertension    Restless legs     Past Surgical History:  Procedure Laterality Date   APPENDECTOMY     CHOLECYSTECTOMY     CIRCUMCISION N/A 03/27/2018   Procedure:  CIRCUMCISION ADULT;  Surgeon: Twylla Glendia BROCKS, MD;  Location: [facility] ORS;  Service: Urology;  Laterality: N/A;  MAC & Local anesthesia   ESOPHAGOGASTRODUODENOSCOPY N/A 02/03/2022   Procedure: ESOPHAGOGASTRODUODENOSCOPY (EGD);  Surgeon: Maryruth Ole DASEN, MD;  Location: [facility] ENDOSCOPY;  Service: Endoscopy;  Laterality: N/A;   rectal fissure      Family History  Problem Relation Age of Onset   Emphysema Father     Social History   Tobacco Use   Smoking status: Some Days    Types: Pipe   Smokeless tobacco: Never  Vaping Use   Vaping status: Never Used  Substance Use Topics   Alcohol use: Not Currently   Drug use: Not Currently    No current facility-administered medications for this encounter.  Current Outpatient Medications:    allopurinol  (ZYLOPRIM ) 300 MG tablet, Take 300 mg by mouth daily. , Disp: , Rfl:    atorvastatin (LIPITOR) 10 MG tablet, Take 5 mg by mouth daily., Disp: , Rfl:    cyanocobalamin 2000 MCG tablet, Take 2,000 mcg by mouth daily., Disp: , Rfl:    gabapentin  (NEURONTIN ) 300 MG capsule, Take 1 capsule (300 mg total) by mouth at bedtime., Disp: 30 capsule, Rfl: 0   glimepiride  (AMARYL ) 4 MG tablet, Take 4 mg by mouth daily with breakfast. , Disp: , Rfl: 1   HYDROcodone -acetaminophen  (NORCO/VICODIN) 5-325 MG tablet, Take 2 tablets by mouth every 4 (four) hours  as needed., Disp: 12 tablet, Rfl: 0   hydrOXYzine  (ATARAX /VISTARIL ) 10 MG tablet, Take 1 tablet (10 mg total) by mouth 3 (three) times daily as needed., Disp: 21 tablet, Rfl: 0   LINZESS 145 MCG CAPS capsule, Take 145 mcg by mouth daily., Disp: , Rfl:    lisinopril -hydrochlorothiazide (PRINZIDE,ZESTORETIC) 20-25 MG tablet, Take 1 tablet by mouth daily., Disp: , Rfl: 1   methocarbamol  (ROBAXIN ) 500 MG tablet, Take 1 tablet (500 mg total) by mouth 2 (two) times daily., Disp: 20 tablet, Rfl: 0   Misc Natural Products (URINOZINC PO), Take 1 tablet by mouth daily., Disp: , Rfl:    pantoprazole (PROTONIX) 40 MG  tablet, Take 40 mg by mouth daily. , Disp: , Rfl:    pioglitazone (ACTOS) 30 MG tablet, Take 1 tablet by mouth daily., Disp: , Rfl:    potassium chloride  SA (KLOR-CON  M20) 20 MEQ tablet, Take 20 mEq by mouth daily. , Disp: , Rfl:    triamcinolone  ointment (KENALOG ) 0.1 %, Apply 1 Application topically 2 (two) times daily., Disp: 30 g, Rfl: 0   valACYclovir  (VALTREX ) 1000 MG tablet, Take 1 tablet (1,000 mg total) by mouth 3 (three) times daily., Disp: 21 tablet, Rfl: 0   metFORMIN (GLUCOPHAGE) 1000 MG tablet, Take 1,000 mg by mouth 2 (two) times daily., Disp: , Rfl: 1  Allergies  Allergen Reactions   Chlorthalidone Other (See Comments)    Unknown   Strawberry Extract Other (See Comments)    Unknown     ROS  As noted in HPI.   Physical Exam  BP 138/82 (BP Location: Left Arm)   Pulse 89   Temp 98.2 F (36.8 C) (Oral)   Wt 97.9 kg   SpO2 95%   BMI 28.47 kg/m   Constitutional: Well developed, well nourished, no acute distress Eyes:  EOMI, conjunctiva normal bilaterally HENT: Normocephalic, atraumatic,mucus membranes moist Respiratory: Normal inspiratory effort, left lung clear Cardiovascular: Normal rate, regular rhythm, no murmurs rubs or gallops. GI: nondistended.  Positive left flank tenderness extending into the mid axillary line.  No rebound.  Active bowel sounds.  Positive voluntary guarding left flank.  Positive tap table test. skin: No rash over the ribs, abdomen or back. skin intact Musculoskeletal: Back: No L-spine tenderness, paralumbar tenderness, rash, CVAT. Neurologic: Alert & oriented x 3, no focal neuro deficits Psychiatric: Speech and behavior appropriate   ED Course   Medications - No data to display  No orders of the defined types were placed in this encounter.   No results found for this or any previous visit (from the past 24 hours). No results found.  ED Clinical Impression  1. Acute left flank pain      ED Assessment/Plan     I am  concerned because the patient now has left flank tenderness.  He states that the pain is similar to the shingles pain, however, it has changed location, moving down lower.  I would be surprised if he had recurrent shingles within 3 weeks that has changed dermatomes.  Son states that the car ride over here was painful for the patient.  He does have left flank tenderness, voluntary guarding and a positive tap table test.  Differential includes colitis, diverticulitis, nephrolithiasis, perforation, obstruction.  Low suspicion for pneumonia, PE, leaking aortic abdominal aneurysm.  Transferring to the emergency department via private vehicle for comprehensive evaluation to rule out these entities.  He is stable to go via private vehicle.  They have opted to go to [place]  or [facility] emergency department.  They had breakfast, but advised them to have no further p.o. intake until ER evaluation is complete.  Discussed rationale for transfer to the emergency department with patient and son.  They agree to go to the ED.  No orders of the defined types were placed in this encounter.     *This clinic note was created using Dragon dictation software. Therefore, there may be occasional mistakes despite careful proofreading.  ?    Van Knee, MD 04/21/24 1010
